# Patient Record
Sex: Female | Born: 1990 | Hispanic: Yes | Smoking: Former smoker
Health system: Southern US, Community
[De-identification: ages and names within clinical notes are randomized; demographics above are authoritative.]

## PROBLEM LIST (undated history)

## (undated) DIAGNOSIS — E282 Polycystic ovarian syndrome: Secondary | ICD-10-CM

## (undated) DIAGNOSIS — R9389 Abnormal findings on diagnostic imaging of other specified body structures: Secondary | ICD-10-CM

## (undated) DIAGNOSIS — D649 Anemia, unspecified: Secondary | ICD-10-CM

## (undated) DIAGNOSIS — R519 Headache, unspecified: Secondary | ICD-10-CM

## (undated) DIAGNOSIS — Z973 Presence of spectacles and contact lenses: Secondary | ICD-10-CM

---

## 2016-10-28 ENCOUNTER — Ambulatory Visit: Payer: Self-pay | Admitting: Dietician

## 2017-01-01 ENCOUNTER — Ambulatory Visit: Payer: Self-pay | Admitting: Skilled Nursing Facility1

## 2018-11-23 DIAGNOSIS — U071 COVID-19: Secondary | ICD-10-CM

## 2018-11-23 HISTORY — DX: COVID-19: U07.1

## 2020-10-17 ENCOUNTER — Other Ambulatory Visit: Payer: Self-pay

## 2020-10-17 ENCOUNTER — Emergency Department (HOSPITAL_COMMUNITY): Payer: Self-pay

## 2020-10-17 ENCOUNTER — Emergency Department (HOSPITAL_COMMUNITY)
Admission: EM | Admit: 2020-10-17 | Discharge: 2020-10-18 | Disposition: A | Discharge status: Home / Self Care | Payer: Self-pay | Attending: Emergency Medicine | Admitting: Emergency Medicine

## 2020-10-17 DIAGNOSIS — R55 Syncope and collapse: Secondary | ICD-10-CM | POA: Insufficient documentation

## 2020-10-17 DIAGNOSIS — R0789 Other chest pain: Secondary | ICD-10-CM

## 2020-10-17 DIAGNOSIS — K529 Noninfective gastroenteritis and colitis, unspecified: Secondary | ICD-10-CM | POA: Insufficient documentation

## 2020-10-17 LAB — BASIC METABOLIC PANEL
Anion gap: 15 (ref 5–15)
BUN: 8 mg/dL (ref 6–20)
CO2: 19 mmol/L — ABNORMAL LOW (ref 22–32)
Calcium: 9.4 mg/dL (ref 8.9–10.3)
Chloride: 105 mmol/L (ref 98–111)
Creatinine, Ser: 0.57 mg/dL (ref 0.44–1.00)
GFR, Estimated: >60 mL/min (ref >60)
Glucose, Bld: 139 mg/dL — ABNORMAL HIGH (ref 70–99)
Potassium: 3.3 mmol/L — ABNORMAL LOW (ref 3.5–5.1)
Sodium: 139 mmol/L (ref 135–145)

## 2020-10-17 LAB — CBC
HCT: 35.7 % — ABNORMAL LOW (ref 36.0–46.0)
Hemoglobin: 9.8 g/dL — ABNORMAL LOW (ref 12.0–15.0)
MCH: 17.6 pg — ABNORMAL LOW (ref 26.0–34.0)
MCHC: 27.5 g/dL — ABNORMAL LOW (ref 30.0–36.0)
MCV: 64 fL — ABNORMAL LOW (ref 80.0–100.0)
Platelets: 354 10*3/uL (ref 150–400)
RBC: 5.58 MIL/uL — ABNORMAL HIGH (ref 3.87–5.11)
RDW: 19.7 % — ABNORMAL HIGH (ref 11.5–15.5)
WBC: 7.8 10*3/uL (ref 4.0–10.5)
nRBC: 0 % (ref 0.0–0.2)

## 2020-10-17 LAB — I-STAT BETA HCG BLOOD, ED (MC, WL, AP ONLY): I-stat hCG, quantitative: <5 m[IU]/mL (ref <5)

## 2020-10-17 LAB — TROPONIN I (HIGH SENSITIVITY): Troponin I (High Sensitivity): <2 ng/L (ref <18)

## 2020-10-17 MED ORDER — LIDOCAINE VISCOUS HCL 2 % MT SOLN
15.0000 mL | Freq: Once | OROMUCOSAL | Status: AC
  Administered 2020-10-18: 15 mL via ORAL
  Filled 2020-10-17: qty 15

## 2020-10-17 MED ORDER — ALUM & MAG HYDROXIDE-SIMETH 200-200-20 MG/5ML PO SUSP
30.0000 mL | Freq: Once | ORAL | Status: AC
  Administered 2020-10-18: 30 mL via ORAL
  Filled 2020-10-17: qty 30

## 2020-10-17 MED ORDER — LACTATED RINGERS IV BOLUS
1000.0000 mL | Freq: Once | INTRAVENOUS | Status: AC
  Administered 2020-10-18: 1000 mL via INTRAVENOUS

## 2020-10-17 MED ORDER — ONDANSETRON HCL 4 MG/2ML IJ SOLN
4.0000 mg | Freq: Once | INTRAMUSCULAR | Status: AC
  Administered 2020-10-18: 4 mg via INTRAVENOUS
  Filled 2020-10-17: qty 2

## 2020-10-17 NOTE — ED Provider Notes (Signed)
[facility] EMERGENCY DEPARTMENT Provider Note   CSN: [id number] Arrival date & time: 10/17/20  2120     History Chief Complaint  Patient presents with  . Chest Pain  . Near Syncope    Lori Tran is a 29 y.o. female.   Chest Pain Pain location:  Substernal area Pain quality: aching and burning   Pain radiates to:  Does not radiate Pain severity:  Moderate Onset quality:  Gradual Duration:  4 days Timing:  Intermittent Progression:  Waxing and waning Chronicity:  New Context: at rest   Relieved by:  Nothing Worsened by:  Nothing Ineffective treatments:  None tried Associated symptoms: abdominal pain, nausea, near-syncope and vomiting   Associated symptoms: no back pain, no cough, no fever, no headache, no palpitations and no shortness of breath   Near Syncope Associated symptoms include chest pain and abdominal pain. Pertinent negatives include no headaches and no shortness of breath.       No past medical history on file.  There are no problems to display for this patient.      OB History   No obstetric history on file.     No family history on file.  Social History   Tobacco Use  . Smoking status: Not on file  Substance Use Topics  . Alcohol use: Not on file  . Drug use: Not on file    Home Medications Prior to Admission medications   Medication Sig Start Date End Date Taking? Authorizing Provider  ondansetron (ZOFRAN) 4 MG tablet Take 1 tablet (4 mg total) by mouth every 6 (six) hours. 10/18/20   Gilda Crease, MD  pantoprazole (PROTONIX) 40 MG tablet Take 1 tablet (40 mg total) by mouth daily. 10/18/20   Gilda Crease, MD    Allergies    Patient has no known allergies.  Review of Systems   Review of Systems  Constitutional: Negative for chills and fever.  HENT: Negative for congestion and rhinorrhea.   Respiratory: Negative for cough and shortness of breath.   Cardiovascular: Positive for  chest pain and near-syncope. Negative for palpitations.  Gastrointestinal: Positive for abdominal pain, diarrhea, nausea and vomiting. Negative for anal bleeding.  Genitourinary: Positive for vaginal bleeding (PCOS, chronic). Negative for difficulty urinating and dysuria.  Musculoskeletal: Negative for arthralgias and back pain.  Skin: Negative for rash and wound.  Neurological: Negative for light-headedness and headaches.    Physical Exam Updated Vital Signs BP 120/74   Pulse 75   Temp 98.7 F (37.1 C) (Oral)   Resp (!) 25   SpO2 96%   Physical Exam Vitals and nursing note reviewed. Exam conducted with a chaperone present.  Constitutional:      General: She is not in acute distress.    Appearance: Normal appearance.  HENT:     Head: Normocephalic and atraumatic.     Nose: No rhinorrhea.  Eyes:     General:        Right eye: No discharge.        Left eye: No discharge.     Conjunctiva/sclera: Conjunctivae normal.  Cardiovascular:     Rate and Rhythm: Normal rate and regular rhythm.     Heart sounds: No murmur heard.   Pulmonary:     Effort: Pulmonary effort is normal. No respiratory distress.     Breath sounds: No stridor. No decreased breath sounds.  Chest:     Chest wall: No tenderness.  Abdominal:  General: Abdomen is flat. There is no distension.     Palpations: Abdomen is soft.     Tenderness: There is no abdominal tenderness. There is no guarding or rebound.  Musculoskeletal:        General: No tenderness or signs of injury.  Skin:    General: Skin is warm and dry.  Neurological:     General: No focal deficit present.     Mental Status: She is alert. Mental status is at baseline.     Motor: No weakness.  Psychiatric:        Mood and Affect: Mood normal.        Behavior: Behavior normal.     ED Results / Procedures / Treatments   Labs (all labs ordered are listed, but only abnormal results are displayed) Labs Reviewed  BASIC METABOLIC PANEL -  Abnormal; Notable for the following components:      Result Value   Potassium 3.3 (*)    CO2 19 (*)    Glucose, Bld 139 (*)    All other components within normal limits  CBC - Abnormal; Notable for the following components:   RBC 5.58 (*)    Hemoglobin 9.8 (*)    HCT 35.7 (*)    MCV 64.0 (*)    MCH 17.6 (*)    MCHC 27.5 (*)    RDW 19.7 (*)    All other components within normal limits  HEPATIC FUNCTION PANEL - Abnormal; Notable for the following components:   ALT 53 (*)    All other components within normal limits  LIPASE, BLOOD  CBG MONITORING, ED  I-STAT BETA HCG BLOOD, ED ([facility], [facility], [facility] ONLY)  TROPONIN I (HIGH SENSITIVITY)  TROPONIN I (HIGH SENSITIVITY)    EKG EKG Interpretation  Date/Time:  Thursday October 17 2020 21:35:35 EST Ventricular Rate:  73 PR Interval:  138 QRS Duration: 74 QT Interval:  350 QTC Calculation: 385 R Axis:   81 Text Interpretation: Normal sinus rhythm Normal ECG Confirmed by Cherlynn Perches ([id number]) on 10/18/2020 12:01:00 AM Also confirmed by Cherlynn Perches ([id number]), editor Elita Quick ([id number])  on 10/18/2020 10:18:39 AM   Radiology DG Chest Portable 1 View  Result Date: 10/17/2020 CLINICAL DATA:  Nausea, vomiting, abdominal pain for 4 days. Today near syncopal episode. EXAM: PORTABLE CHEST 1 VIEW COMPARISON:  None. FINDINGS: The heart size and mediastinal contours are within normal limits. Both lungs are clear. The visualized skeletal structures are unremarkable. IMPRESSION: No active disease. Electronically Signed   By: Burman Nieves M.D.   On: 10/17/2020 23:25    Procedures Procedures (including critical care time)  Medications Ordered in ED Medications  alum & mag hydroxide-simeth (MAALOX/MYLANTA) 200-200-20 MG/5ML suspension 30 mL (30 mLs Oral Given 10/18/20 0015)    And  lidocaine (XYLOCAINE) 2 % viscous mouth solution 15 mL (15 mLs Oral Given 10/18/20 0015)  ondansetron (ZOFRAN) injection 4 mg (4 mg Intravenous Given 10/18/20 0015)   lactated ringers bolus 1,000 mL (0 mLs Intravenous Stopped 10/18/20 0115)    ED Course  I have reviewed the triage vital signs and the nursing notes.  Pertinent labs & imaging results that were available during my care of the patient were reviewed by me and considered in my medical decision making (see chart for details).    MDM Rules/Calculators/A&P                          Worsening nausea vomiting diarrhea, chest pain,  pain comes on that she feels nauseated has to vomit.  Intermittent for the last 4 days.  No significant medical history other PCOS and chronic anemia, has been told to take medications for all these problems but does not do so because she does not feel that they work.  Vital signs here are stable she is afebrile.  She has a nonperitoneal ache abdomen.  She has concerns for gastritis versus other nonspecific chest pain.  Her PERC status is negative.  EKG shows sinus rhythm without acute ischemic change interval abnormality or arrhythmia.  Chest x-ray reviewed by me and radiology shows no acute cardiopulmonary pathology.  Chemistry shows metabolic acidosis, possibly ketosis in the setting of dehydration.  IV fluids given IV antiemetics given.  She also get a GI cocktail as this pain does sound to be gastrointestinal in origin.  She has a first-time troponin is undetectable.  She is getting a second troponin and we are adding on lipase and LFTs to her triage labs.  She will need these labs resulted she will need to improve her hydration status prior to leaving.  Pt care was handed off to on coming provider at 0000.  Complete history and physical and current plan have been communicated.  Please refer to their note for the remainder of ED care and ultimate disposition.  Pt seen in conjunction with Dr. Blinda Leatherwood   Final Clinical Impression(s) / ED Diagnoses Final diagnoses:  Atypical chest pain  Gastroenteritis    Rx / DC Orders ED Discharge Orders         Ordered     ondansetron (ZOFRAN) 4 MG tablet  Every 6 hours        10/18/20 0210    pantoprazole (PROTONIX) 40 MG tablet  Daily        10/18/20 0210           Sabino Donovan, MD 10/18/20 1443

## 2020-10-17 NOTE — ED Triage Notes (Signed)
Pt reports n/v/abd pain X4 days. Tonight she reports a near syncopal episode and her sister told her her lips were turning blue.  Pt is alert and oriented, did become anxious in triage, RN assisted her in slowing down her breathing and she appeared to be doing better.

## 2020-10-18 LAB — HEPATIC FUNCTION PANEL
ALT: 53 U/L — ABNORMAL HIGH (ref 0–44)
AST: 38 U/L (ref 15–41)
Albumin: 3.7 g/dL (ref 3.5–5.0)
Alkaline Phosphatase: 123 U/L (ref 38–126)
Bilirubin, Direct: <0.1 mg/dL (ref 0.0–0.2)
Total Bilirubin: 0.7 mg/dL (ref 0.3–1.2)
Total Protein: 7.5 g/dL (ref 6.5–8.1)

## 2020-10-18 LAB — LIPASE, BLOOD: Lipase: 28 U/L (ref 11–51)

## 2020-10-18 LAB — TROPONIN I (HIGH SENSITIVITY): Troponin I (High Sensitivity): <2 ng/L (ref <18)

## 2020-10-18 MED ORDER — ONDANSETRON HCL 4 MG PO TABS: 4.0000 mg | ORAL_TABLET | Freq: Four times a day (QID) | ORAL | 0 refills | Status: DC

## 2020-10-18 MED ORDER — PANTOPRAZOLE SODIUM 40 MG PO TBEC: 40.0000 mg | DELAYED_RELEASE_TABLET | Freq: Every day | ORAL | 0 refills | Status: DC

## 2020-10-18 NOTE — ED Provider Notes (Signed)
Patient signed out to me by Dr. Myrtis Ser to follow-up on lab work.  Patient seen with gastroenteritis symptoms and chest discomfort.  LFTs, lipase, troponin pending at time of signout.  These have been reviewed and are normal.  Patient felt to be extremely low risk for cardiac etiology, no further cardiac work-up necessary.  Will discharge with antiemetics, PPI.   Gilda Crease, MD 10/18/20 [phone]

## 2020-10-24 ENCOUNTER — Encounter (HOSPITAL_COMMUNITY): Payer: Self-pay | Admitting: Emergency Medicine

## 2020-10-24 ENCOUNTER — Emergency Department (HOSPITAL_COMMUNITY): Payer: Self-pay

## 2020-10-24 ENCOUNTER — Other Ambulatory Visit: Payer: Self-pay

## 2020-10-24 ENCOUNTER — Emergency Department (HOSPITAL_COMMUNITY)
Admission: EM | Admit: 2020-10-24 | Discharge: 2020-10-24 | Disposition: A | Discharge status: Home / Self Care | Payer: Self-pay | Attending: Emergency Medicine | Admitting: Emergency Medicine

## 2020-10-24 DIAGNOSIS — N939 Abnormal uterine and vaginal bleeding, unspecified: Secondary | ICD-10-CM

## 2020-10-24 DIAGNOSIS — D649 Anemia, unspecified: Secondary | ICD-10-CM

## 2020-10-24 DIAGNOSIS — R102 Pelvic and perineal pain: Secondary | ICD-10-CM

## 2020-10-24 DIAGNOSIS — R42 Dizziness and giddiness: Secondary | ICD-10-CM | POA: Insufficient documentation

## 2020-10-24 DIAGNOSIS — R0789 Other chest pain: Secondary | ICD-10-CM | POA: Insufficient documentation

## 2020-10-24 HISTORY — DX: Polycystic ovarian syndrome: E28.2

## 2020-10-24 LAB — URINALYSIS, ROUTINE W REFLEX MICROSCOPIC
Bilirubin Urine: NEGATIVE
Glucose, UA: NEGATIVE mg/dL
Ketones, ur: NEGATIVE mg/dL
Nitrite: NEGATIVE
Protein, ur: 100 mg/dL — AB
Specific Gravity, Urine: 1.008 (ref 1.005–1.030)
WBC, UA: >50 WBC/hpf — ABNORMAL HIGH (ref 0–5)
pH: 6 (ref 5.0–8.0)

## 2020-10-24 LAB — BASIC METABOLIC PANEL
Anion gap: 10 (ref 5–15)
BUN: 9 mg/dL (ref 6–20)
CO2: 20 mmol/L — ABNORMAL LOW (ref 22–32)
Calcium: 8.8 mg/dL — ABNORMAL LOW (ref 8.9–10.3)
Chloride: 106 mmol/L (ref 98–111)
Creatinine, Ser: 0.68 mg/dL (ref 0.44–1.00)
GFR, Estimated: >60 mL/min (ref >60)
Glucose, Bld: 152 mg/dL — ABNORMAL HIGH (ref 70–99)
Potassium: 3.6 mmol/L (ref 3.5–5.1)
Sodium: 136 mmol/L (ref 135–145)

## 2020-10-24 LAB — CBC
HCT: 29.5 % — ABNORMAL LOW (ref 36.0–46.0)
Hemoglobin: 8.2 g/dL — ABNORMAL LOW (ref 12.0–15.0)
MCH: 17.9 pg — ABNORMAL LOW (ref 26.0–34.0)
MCHC: 27.8 g/dL — ABNORMAL LOW (ref 30.0–36.0)
MCV: 64.3 fL — ABNORMAL LOW (ref 80.0–100.0)
Platelets: 314 10*3/uL (ref 150–400)
RBC: 4.59 MIL/uL (ref 3.87–5.11)
RDW: 19.1 % — ABNORMAL HIGH (ref 11.5–15.5)
WBC: 5.9 10*3/uL (ref 4.0–10.5)
nRBC: 0 % (ref 0.0–0.2)

## 2020-10-24 LAB — WET PREP, GENITAL
Clue Cells Wet Prep HPF POC: NONE SEEN
Sperm: NONE SEEN
Trich, Wet Prep: NONE SEEN
Yeast Wet Prep HPF POC: NONE SEEN

## 2020-10-24 LAB — I-STAT BETA HCG BLOOD, ED (MC, WL, AP ONLY): I-stat hCG, quantitative: <5 m[IU]/mL (ref <5)

## 2020-10-24 MED ORDER — METOCLOPRAMIDE HCL 5 MG/ML IJ SOLN
10.0000 mg | Freq: Once | INTRAMUSCULAR | Status: AC
  Administered 2020-10-24: 10 mg via INTRAVENOUS
  Filled 2020-10-24: qty 2

## 2020-10-24 MED ORDER — DIPHENHYDRAMINE HCL 50 MG/ML IJ SOLN
50.0000 mg | Freq: Once | INTRAMUSCULAR | Status: AC
  Administered 2020-10-24: 50 mg via INTRAVENOUS
  Filled 2020-10-24: qty 1

## 2020-10-24 MED ORDER — HYDROXYZINE HCL 25 MG PO TABS
25.0000 mg | ORAL_TABLET | Freq: Once | ORAL | Status: AC
  Administered 2020-10-24: 25 mg via ORAL
  Filled 2020-10-24: qty 1

## 2020-10-24 MED ORDER — KETOROLAC TROMETHAMINE 15 MG/ML IJ SOLN
15.0000 mg | Freq: Once | INTRAMUSCULAR | Status: AC
  Administered 2020-10-24: 15 mg via INTRAVENOUS
  Filled 2020-10-24: qty 1

## 2020-10-24 MED ORDER — SODIUM CHLORIDE 0.9 % IV BOLUS
1000.0000 mL | Freq: Once | INTRAVENOUS | Status: AC
  Administered 2020-10-24: 1000 mL via INTRAVENOUS

## 2020-10-24 MED ORDER — MEGESTROL ACETATE 40 MG PO TABS: 40.0000 mg | ORAL_TABLET | Freq: Two times a day (BID) | ORAL | 0 refills | Status: DC

## 2020-10-24 NOTE — ED Notes (Signed)
Ambulated self to the bathroom

## 2020-10-24 NOTE — ED Provider Notes (Signed)
Care assumed from Army Melia, PA-C, at shift change, please see their notes for full documentation of patient's complaint/HPI. Briefly, pt here with complaint of dizziness, headache, and nausea. Results so far show hgb 8.2 with known anemia; not on any medications. Awaiting CT Head. Plan is to reevaluate; suspect that pt's symptoms may be related to anxiety and pt provided with atarax; if improvement after atarax and CT clear pt can be discharged home. If no improvement will provide headache cocktail.    Physical Exam  BP 95/77 (BP Location: Right Arm)   Pulse (!) 53   Temp 98.1 F (36.7 C) (Oral)   Resp 16   SpO2 99%   Physical Exam Vitals and nursing note reviewed.  Constitutional:      Appearance: She is not ill-appearing.  HENT:     Head: Normocephalic and atraumatic.  Eyes:     Conjunctiva/sclera: Conjunctivae normal.  Cardiovascular:     Rate and Rhythm: Normal rate and regular rhythm.  Pulmonary:     Effort: Pulmonary effort is normal.     Breath sounds: Normal breath sounds.  Genitourinary:    Comments: Chaperone present for exam. No rashes, lesions, or tenderness to external genitalia. No erythema, injury, or tenderness to vaginal mucosa. Moderate amount of blood in vault. No hemorrhage from Os appreciated. No adnexal masses, tenderness, or fullness. No CMT, cervical friability, or discharge from cervical os. Cervical os is closed. Uterus non-deviated, mobile, nonTTP, and without enlargement.  Skin:    General: Skin is warm and dry.     Coloration: Skin is not jaundiced.  Neurological:     Mental Status: She is alert.     ED Course/Procedures   Clinical Course as of Oct 24 2054  Thu Oct 24, 2020  585 29 year old female with complaint of dizziness, headache, nausea and chest pressure.  Neuro exam is unremarkable, dizziness is reproduced with changes in position.  Report of new onset headaches for the past week, no prior head imaging.  Symptoms may be related to stress  as relayed by patient however due to new headache history with no prior imaging, will CT head.  Also given Atarax for her symptoms.  Plan is to reassess after head imaging.   [LM]  1502 Care signed out pending imaging and reassessment.    [LM]    Clinical Course User Index [LM] Jeannie Fend, PA-C    Procedures  Results for orders placed or performed during the hospital encounter of 10/24/20  Wet prep, genital   Specimen: PATH Cytology Cervicovaginal Ancillary Only  Result Value Ref Range   Yeast Wet Prep HPF POC NONE SEEN NONE SEEN   Trich, Wet Prep NONE SEEN NONE SEEN   Clue Cells Wet Prep HPF POC NONE SEEN NONE SEEN   WBC, Wet Prep HPF POC MODERATE (A) NONE SEEN   Sperm NONE SEEN   Basic metabolic panel  Result Value Ref Range   Sodium 136 135 - 145 mmol/L   Potassium 3.6 3.5 - 5.1 mmol/L   Chloride 106 98 - 111 mmol/L   CO2 20 (L) 22 - 32 mmol/L   Glucose, Bld 152 (H) 70 - 99 mg/dL   BUN 9 6 - 20 mg/dL   Creatinine, Ser 4.03 0.44 - 1.00 mg/dL   Calcium 8.8 (L) 8.9 - 10.3 mg/dL   GFR, Estimated >47 >42 mL/min   Anion gap 10 5 - 15  CBC  Result Value Ref Range   WBC 5.9 4.0 - 10.5  K/uL   RBC 4.59 3.87 - 5.11 MIL/uL   Hemoglobin 8.2 (L) 12.0 - 15.0 g/dL   HCT 74.2 (L) 36 - 46 %   MCV 64.3 (L) 80.0 - 100.0 fL   MCH 17.9 (L) 26.0 - 34.0 pg   MCHC 27.8 (L) 30.0 - 36.0 g/dL   RDW 59.5 (H) 63.8 - 75.6 %   Platelets 314 150 - 400 K/uL   nRBC 0.0 0.0 - 0.2 %  Urinalysis, Routine w reflex microscopic Urine, Clean Catch  Result Value Ref Range   Color, Urine YELLOW YELLOW   APPearance CLOUDY (A) CLEAR   Specific Gravity, Urine 1.008 1.005 - 1.030   pH 6.0 5.0 - 8.0   Glucose, UA NEGATIVE NEGATIVE mg/dL   Hgb urine dipstick LARGE (A) NEGATIVE   Bilirubin Urine NEGATIVE NEGATIVE   Ketones, ur NEGATIVE NEGATIVE mg/dL   Protein, ur 433 (A) NEGATIVE mg/dL   Nitrite NEGATIVE NEGATIVE   Leukocytes,Ua LARGE (A) NEGATIVE   RBC / HPF 21-50 0 - 5 RBC/hpf   WBC, UA >50 (H) 0 -  5 WBC/hpf   Bacteria, UA MANY (A) NONE SEEN   Squamous Epithelial / LPF 11-20 0 - 5   Mucus PRESENT   I-Stat beta hCG blood, ED  Result Value Ref Range   I-stat hCG, quantitative <5.0 <5 mIU/mL   Comment 3           CT Head Wo Contrast  Result Date: 10/24/2020 CLINICAL DATA:  Dizziness, nonspecific. Nausea, dizziness, headache, chest discomfort. EXAM: CT HEAD WITHOUT CONTRAST TECHNIQUE: Contiguous axial images were obtained from the base of the skull through the vertex without intravenous contrast. COMPARISON:  No pertinent prior exams available for comparison. FINDINGS: Brain: Cerebral volume is normal. There is no acute intracranial hemorrhage. No demarcated cortical infarct. No extra-axial fluid collection. No evidence of intracranial mass. No midline shift. Vascular: No hyperdense vessel. Skull: Normal. Negative for fracture or focal lesion. Sinuses/Orbits: Visualized orbits show no acute finding. Trace ethmoid sinus mucosal thickening. IMPRESSION: Unremarkable non-contrast CT appearance of the brain. No evidence of acute intracranial abnormality. Electronically Signed   By: Jackey Loge DO   On: 10/24/2020 15:19   US PELVIC COMPLETE W TRANSVAGINAL AND TORSION R/O  Result Date: 10/24/2020 CLINICAL DATA:  Heavy vaginal bleeding for several years. Pelvic pain for several months. EXAM: TRANSABDOMINAL AND TRANSVAGINAL ULTRASOUND OF PELVIS DOPPLER ULTRASOUND OF OVARIES TECHNIQUE: Both transabdominal and transvaginal ultrasound examinations of the pelvis were performed. Transabdominal technique was performed for global imaging of the pelvis including uterus, ovaries, adnexal regions, and pelvic cul-de-sac. It was necessary to proceed with endovaginal exam following the transabdominal exam to visualize the ovaries. Color and duplex Doppler ultrasound was utilized to evaluate blood flow to the ovaries. COMPARISON:  None. FINDINGS: Uterus Measurements: 10 x 4.5 x 4.7 cm (volume = 110 cm^3.) No fibroids or  other mass visualized. Endometrium Thickness: 1.6 cm.  No focal abnormality visualized. Right ovary Measurements: 2.6 x 1.5 x 2.3 cm = volume: 4.7 mL. Normal appearance/no adnexal mass. Left ovary Measurements: 1.9 x 1.4 x 1.8 cm = volume: 2.5 mL. Normal appearance/no adnexal mass. Pulsed Doppler evaluation of both ovaries demonstrates normal low-resistance arterial and venous waveforms. Other findings No abnormal free fluid. IMPRESSION: 1. The endometrium measures 1.6 cm in maximum thickness. This is upper limits of normal. If bleeding remains unresponsive to hormonal or medical therapy, focal lesion work-up with sonohysterogram should be considered. Endometrial biopsy should also be considered in pre-menopausal  patients at high risk for endometrial carcinoma. (Ref: Radiological Reasoning: Algorithmic Workup of Abnormal Vaginal Bleeding with Endovaginal Sonography and Sonohysterography. AJR 2008; 491:P91-50) Electronically Signed   By: Signa Kell M.D.   On: 10/24/2020 19:14    MDM  CT Head negative. Pt reevaluated; states no improvement in symptoms. Will provide fluids and headache cocktail.   U/A has returned with large leuks, 21-50 RBCs, and > 50 WBCs. Pt is currently on her menses. Denies any urinary symptoms - states she is having some pelvic pressure however she has PCOS and this is normal for her. Will plan for urine culture however will hold off on treatment at this time.   Nursing staff informed that pt provided to him that she has been bleeding consecutively for the past several years; she has not seen an OBGYN for same. She would like a pelvic exam done today while she is here. Her Hgb is significantly lower at 8.2 vs 9.8 1 week ago.   Hemoglobin  Date Value Ref Range Status  10/24/2020 8.2 (L) 12.0 - 15.0 g/dL Final    Comment:    Reticulocyte Hemoglobin testing may be clinically indicated, consider ordering this additional test [id number]   10/17/2020 9.8 (L) 12.0 - 15.0 g/dL Final     Pelvic ultrasound performed: IMPRESSION:  1. The endometrium measures 1.6 cm in maximum thickness. This is  upper limits of normal. If bleeding remains unresponsive to hormonal  or medical therapy, focal lesion work-up with sonohysterogram should  be considered. Endometrial biopsy should also be considered in  pre-menopausal patients at high risk for endometrial carcinoma.  (Ref: Radiological Reasoning: Algorithmic Workup of Abnormal Vaginal  Bleeding with Endovaginal Sonography and Sonohysterography. AJR  2008; 165:V37-48)   Pelvic exam with moderate amount of blood in vault. No other findings. Have updated patient on ultrasound results. She is instructed to follow up with OBGYN for same. Have discussed with Dr. Theodoro Parma on call who recommends Megace 40 mg BID until the bleeding stops with close OBGYN follow up.  I suspect anemia is causing pt's symptoms today. She is in agreement with plan and stable for discharge home.   This note was prepared using Dragon voice recognition software and may include unintentional dictation errors due to the inherent limitations of voice recognition software.    Tanda Rockers, PA-C 10/24/20 2055    Benjiman Core, MD 10/25/20 [phone]

## 2020-10-24 NOTE — Discharge Instructions (Addendum)
Please pick up medication and take daily until the bleeding stops. Follow up with [facility] for follow up purposes given your prolonged vaginal bleeding. They can represcribe additional medication for you. I suspect significant anemia is likely the cause of your symptoms today. Once the bleeding stops it should help with your anemia as well.   Return to the ED for any worsening symptoms

## 2020-10-24 NOTE — ED Notes (Signed)
Asked pt if she could provide urine sample. Pt states not at this moment. Gave pt urine cup

## 2020-10-24 NOTE — ED Notes (Signed)
Pt transported to CT ?

## 2020-10-24 NOTE — ED Provider Notes (Signed)
[facility] EMERGENCY DEPARTMENT Provider Note   CSN: [id number] Arrival date & time: 10/24/20  4098     History Chief Complaint  Patient presents with   Dizziness   Nausea    Lori Tran is a 29 y.o. female.  29 year old female presents with complaint of nausea, dizziness, headache and chest discomfort.  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  Dizziness resolves with holding still, returns with changes in position.  Reports initially right temple headache now across right frontal area.  States that she has been getting these headaches for the past week and has been taking ibuprofen for them.  No prior headache history.  Patient states that she felt so nauseous she developed a pressure sensation in her chest, pressure sensation in her chest only happens when she feels nauseous.  Patient was seen in the emergency room about a week ago with similar symptoms with a negative work-up, was discharged home on Protonix which she is not taking because she feels like this causes diarrhea.  Reports history of prediabetes due to her PCOS, is not on Metformin due to diarrhea from the medication.  Patient does not normally eat breakfast in the morning prior to going to work, does not monitor her blood sugar.  Denies any unilateral weakness or numbness, changes in vision, speech, gait.  Patient reports her symptoms started shortly after she broke up with her fianc and he moved out, she is tearful and questions if stress can cause her to have the symptoms she has been experiencing.  No other complaints or concerns.        Past Medical History:  Diagnosis Date   PCOS (polycystic ovarian syndrome)     There are no problems to display for this patient.   History reviewed. No pertinent surgical history.   OB History   No obstetric history on file.     No family history on  file.  Social History   Tobacco Use   Smoking status: Not on file  Substance Use Topics   Alcohol use: Not on file   Drug use: Not on file    Home Medications Prior to Admission medications   Medication Sig Start Date End Date Taking? Authorizing Provider  ondansetron (ZOFRAN) 4 MG tablet Take 1 tablet (4 mg total) by mouth every 6 (six) hours. 10/18/20   Gilda Crease, MD  pantoprazole (PROTONIX) 40 MG tablet Take 1 tablet (40 mg total) by mouth daily. 10/18/20   Gilda Crease, MD    Allergies    Patient has no known allergies.  Review of Systems   Review of Systems  Constitutional: Negative for chills and fever.  Eyes: Negative for visual disturbance.  Respiratory: Negative for shortness of breath.   Cardiovascular: Positive for chest pain.  Gastrointestinal: Positive for nausea. Negative for abdominal pain, constipation, diarrhea and vomiting.  Musculoskeletal: Negative for arthralgias, back pain and myalgias.  Skin: Negative for rash and wound.  Allergic/Immunologic: Negative for immunocompromised state.  Neurological: Positive for dizziness and headaches. Negative for speech difficulty, weakness and numbness.  Psychiatric/Behavioral: Negative for confusion. The patient is nervous/anxious.   All other systems reviewed and are negative.   Physical Exam Updated Vital Signs BP 95/77 (BP Location: Right Arm)    Pulse (!) 53    Temp 98.1 F (36.7 C) (Oral)    Resp 16  SpO2 99%   Physical Exam Vitals and nursing note reviewed.  Constitutional:      General: She is not in acute distress.    Appearance: She is well-developed. She is not diaphoretic.  HENT:     Head: Normocephalic and atraumatic.     Mouth/Throat:     Mouth: Mucous membranes are moist.  Eyes:     Extraocular Movements: Extraocular movements intact.     Pupils: Pupils are equal, round, and reactive to light.  Cardiovascular:     Rate and Rhythm: Normal rate and regular rhythm.      Pulses: Normal pulses.     Heart sounds: Normal heart sounds.  Pulmonary:     Effort: Pulmonary effort is normal.     Breath sounds: Normal breath sounds.  Skin:    General: Skin is warm and dry.     Findings: No erythema or rash.  Neurological:     Mental Status: She is alert and oriented to person, place, and time.     Cranial Nerves: No cranial nerve deficit.     Sensory: No sensory deficit.     Motor: No weakness.     Coordination: Coordination normal.  Psychiatric:        Behavior: Behavior normal.     ED Results / Procedures / Treatments   Labs (all labs ordered are listed, but only abnormal results are displayed) Labs Reviewed  BASIC METABOLIC PANEL - Abnormal; Notable for the following components:      Result Value   CO2 20 (*)    Glucose, Bld 152 (*)    Calcium 8.8 (*)    All other components within normal limits  CBC - Abnormal; Notable for the following components:   Hemoglobin 8.2 (*)    HCT 29.5 (*)    MCV 64.3 (*)    MCH 17.9 (*)    MCHC 27.8 (*)    RDW 19.1 (*)    All other components within normal limits  URINALYSIS, ROUTINE W REFLEX MICROSCOPIC  CBG MONITORING, ED  I-STAT BETA HCG BLOOD, ED ([facility], [facility], [facility] ONLY)    EKG None  Radiology No results found.  Procedures Procedures (including critical care time)  Medications Ordered in ED Medications  hydrOXYzine (ATARAX/VISTARIL) tablet 25 mg (25 mg Oral Given 10/24/20 1403)    ED Course  I have reviewed the triage vital signs and the nursing notes.  Pertinent labs & imaging results that were available during my care of the patient were reviewed by me and considered in my medical decision making (see chart for details).  Clinical Course as of Oct 24 1501  Thu Oct 24, 2020  274 29 year old female with complaint of dizziness, headache, nausea and chest pressure.  Neuro exam is unremarkable, dizziness is reproduced with changes in position.  Report of new onset headaches for the past week, no prior  head imaging.  Symptoms may be related to stress as relayed by patient however due to new headache history with no prior imaging, will CT head.  Also given Atarax for her symptoms.  Plan is to reassess after head imaging.   [LM]  1502 Care signed out pending imaging and reassessment.    [LM]    Clinical Course User Index [LM] Alden Hipp   MDM Rules/Calculators/A&P                          Final Clinical Impression(s) / ED Diagnoses Final diagnoses:  None    Rx / DC Orders ED Discharge Orders    None       Alden Hipp 10/24/20 1502    Lori Monday, MD 10/25/20 [phone]

## 2020-10-24 NOTE — ED Triage Notes (Signed)
Pt arrives via [facility], reports nausea and dizziness upon waking, states she went to work and began having burning in her chest. Reports she was here for similar about a week ago, placed on some medications but states she began having diarrhea from the meds so she stopped them. A/ox4, resp e/u, nad. EMS VSS 138/82, HR 60, o2 100% ra, cbg 161

## 2020-10-25 LAB — GC/CHLAMYDIA PROBE AMP (~~LOC~~) NOT AT ARMC
Chlamydia: NEGATIVE
Comment: NEGATIVE
Comment: NORMAL
Neisseria Gonorrhea: NEGATIVE

## 2020-10-26 LAB — URINE CULTURE

## 2020-11-10 ENCOUNTER — Encounter (HOSPITAL_COMMUNITY): Payer: Self-pay | Admitting: *Deleted

## 2020-11-10 ENCOUNTER — Emergency Department (HOSPITAL_COMMUNITY)
Admission: EM | Admit: 2020-11-10 | Discharge: 2020-11-11 | Disposition: A | Discharge status: Home / Self Care | Payer: Self-pay | Attending: Emergency Medicine | Admitting: Emergency Medicine

## 2020-11-10 ENCOUNTER — Other Ambulatory Visit: Payer: Self-pay

## 2020-11-10 DIAGNOSIS — Z5321 Procedure and treatment not carried out due to patient leaving prior to being seen by health care provider: Secondary | ICD-10-CM | POA: Insufficient documentation

## 2020-11-10 DIAGNOSIS — R1030 Lower abdominal pain, unspecified: Secondary | ICD-10-CM | POA: Insufficient documentation

## 2020-11-10 DIAGNOSIS — R350 Frequency of micturition: Secondary | ICD-10-CM | POA: Insufficient documentation

## 2020-11-10 DIAGNOSIS — R102 Pelvic and perineal pain: Secondary | ICD-10-CM | POA: Insufficient documentation

## 2020-11-10 DIAGNOSIS — N898 Other specified noninflammatory disorders of vagina: Secondary | ICD-10-CM | POA: Insufficient documentation

## 2020-11-10 LAB — COMPREHENSIVE METABOLIC PANEL
ALT: 37 U/L (ref 0–44)
AST: 22 U/L (ref 15–41)
Albumin: 3.6 g/dL (ref 3.5–5.0)
Alkaline Phosphatase: 140 U/L — ABNORMAL HIGH (ref 38–126)
Anion gap: 12 (ref 5–15)
BUN: 11 mg/dL (ref 6–20)
CO2: 16 mmol/L — ABNORMAL LOW (ref 22–32)
Calcium: 9.1 mg/dL (ref 8.9–10.3)
Chloride: 108 mmol/L (ref 98–111)
Creatinine, Ser: 0.73 mg/dL (ref 0.44–1.00)
GFR, Estimated: >60 mL/min (ref >60)
Glucose, Bld: 196 mg/dL — ABNORMAL HIGH (ref 70–99)
Potassium: 3.7 mmol/L (ref 3.5–5.1)
Sodium: 136 mmol/L (ref 135–145)
Total Bilirubin: 0.5 mg/dL (ref 0.3–1.2)
Total Protein: 7.9 g/dL (ref 6.5–8.1)

## 2020-11-10 LAB — I-STAT BETA HCG BLOOD, ED (MC, WL, AP ONLY): I-stat hCG, quantitative: <5 m[IU]/mL (ref <5)

## 2020-11-10 LAB — CBC
HCT: 34.7 % — ABNORMAL LOW (ref 36.0–46.0)
Hemoglobin: 9.6 g/dL — ABNORMAL LOW (ref 12.0–15.0)
MCH: 17.4 pg — ABNORMAL LOW (ref 26.0–34.0)
MCHC: 27.7 g/dL — ABNORMAL LOW (ref 30.0–36.0)
MCV: 62.7 fL — ABNORMAL LOW (ref 80.0–100.0)
Platelets: 396 10*3/uL (ref 150–400)
RBC: 5.53 MIL/uL — ABNORMAL HIGH (ref 3.87–5.11)
RDW: 20.1 % — ABNORMAL HIGH (ref 11.5–15.5)
WBC: 10.4 10*3/uL (ref 4.0–10.5)
nRBC: 0 % (ref 0.0–0.2)

## 2020-11-10 LAB — LIPASE, BLOOD: Lipase: 30 U/L (ref 11–51)

## 2020-11-10 MED ORDER — OXYCODONE-ACETAMINOPHEN 5-325 MG PO TABS
1.0000 | ORAL_TABLET | ORAL | Status: DC | PRN
  Administered 2020-11-10: 1 via ORAL
  Filled 2020-11-10: qty 1

## 2020-11-10 NOTE — ED Triage Notes (Signed)
Pt reports onset of lower abd/pelvic pain x 3 days. Pain has become severe and reports yellow vaginal discharge and frequent urination.

## 2020-11-11 NOTE — ED Notes (Signed)
Called pt for vitals recheck and no answer 

## 2020-11-14 ENCOUNTER — Ambulatory Visit (INDEPENDENT_AMBULATORY_CARE_PROVIDER_SITE_OTHER): Payer: Self-pay | Admitting: Obstetrics & Gynecology

## 2020-11-14 ENCOUNTER — Encounter: Payer: Self-pay | Admitting: Obstetrics & Gynecology

## 2020-11-14 ENCOUNTER — Other Ambulatory Visit: Payer: Self-pay

## 2020-11-14 VITALS — BP 132/82 | HR 79 | Ht 61.0 in | Wt 202.5 lb

## 2020-11-14 DIAGNOSIS — Z3202 Encounter for pregnancy test, result negative: Secondary | ICD-10-CM

## 2020-11-14 DIAGNOSIS — E282 Polycystic ovarian syndrome: Secondary | ICD-10-CM

## 2020-11-14 DIAGNOSIS — N926 Irregular menstruation, unspecified: Secondary | ICD-10-CM | POA: Insufficient documentation

## 2020-11-14 DIAGNOSIS — F3289 Other specified depressive episodes: Secondary | ICD-10-CM

## 2020-11-14 DIAGNOSIS — F419 Anxiety disorder, unspecified: Secondary | ICD-10-CM

## 2020-11-14 DIAGNOSIS — D500 Iron deficiency anemia secondary to blood loss (chronic): Secondary | ICD-10-CM

## 2020-11-14 LAB — POCT PREGNANCY, URINE: Preg Test, Ur: NEGATIVE

## 2020-11-14 MED ORDER — FERROUS SULFATE 325 (65 FE) MG PO TABS: ORAL_TABLET | ORAL | 0 refills | Status: AC

## 2020-11-14 MED ORDER — MEGESTROL ACETATE 40 MG PO TABS: 40.0000 mg | ORAL_TABLET | Freq: Two times a day (BID) | ORAL | 1 refills | Status: DC

## 2020-11-14 NOTE — Patient Instructions (Addendum)
Call [facility] to schedule free pap smear [phone]   Call MyChart help desk at [phone]  To help with linking [facility] MyChart and [facility] MyChart  Please start iron sulfate 325mg  every other day.  Take with OJ if possible.

## 2020-11-14 NOTE — Progress Notes (Signed)
GYNECOLOGY  VISIT  CC:   Menorrhagia/anemia  HPI: 29 y.o. G0 Hispanic female here for discussion of recent ultrasounds and of five years of heavy cycles.  Pt reports hx of abnormal cycles that has been present for several years.  She reports she has ongoing bleeding for most days out of the month.  In any given month, she will only have five days of no bleeding.  She doesn't really know when she has a cycle and when she doesn't.  Some days of bleeding can be really heavy with clotting.  On the heaviest days, she can need to change a maxi pad every 30 minutes.    Ultrasound done 10/24/2020 at [facility] ER. Endometrium was 1.6cm.  She was started on Megace.  Ultrasound done at [facility] (which I could review on her phone) on 12/202/2021 showed a subtle thickening in the cervical region measuring up to 1.8cm but the entire endometrium was thickened and hypervascular.    GYNECOLOGIC HISTORY: No LMP recorded (lmp unknown). (Menstrual status: Irregular Periods). Contraception: abstinence   Patient Active Problem List   Diagnosis Date Noted   Irregular bleeding 11/14/2020   PCOS (polycystic ovarian syndrome) 11/14/2020    Past Medical History:  Diagnosis Date   Anxiety    Depression    PCOS (polycystic ovarian syndrome)     History reviewed. No pertinent surgical history.  MEDS:   Current Outpatient Medications on File Prior to Visit  Medication Sig Dispense Refill   Multiple Vitamin (MULTIVITAMIN ADULT PO) Take 1 tablet by mouth daily.     ondansetron (ZOFRAN) 4 MG tablet Take 1 tablet (4 mg total) by mouth every 6 (six) hours. 12 tablet 0   oxyCODONE (OXY IR/ROXICODONE) 5 MG immediate release tablet SMARTSIG:1 By Mouth 4-5 Times Daily (Patient not taking: Reported on 11/14/2020)     No current facility-administered medications on file prior to visit.    ALLERGIES: Patient has no known allergies.  Family History  Problem Relation Age of Onset   Diabetes Mother     SH:  Separated,  non smoker  Review of Systems  Constitutional: Negative.   Respiratory: Negative.   Cardiovascular: Negative.   Genitourinary: Positive for menstrual problem (irregular bleeding).  Skin: Negative.   Hematological: Negative.     PHYSICAL EXAMINATION:    BP 132/82    Pulse 79    Ht 5\' 1"  (1.549 m)    Wt 202 lb 8 oz (91.9 kg)    LMP  (LMP Unknown)    BMI 38.26 kg/m     General appearance: alert, cooperative and appears stated age CV:  Regular rate and rhythm Lungs:  clear to auscultation, no wheezes, rales or rhonchi, symmetric air entry Abdomen: soft, non-tender; bowel sounds normal; no masses,  no organomegaly Lymph:  no inguinal LAD noted  Pelvic: External genitalia:  no lesions              Urethra:  normal appearing urethra with no masses, tenderness or lesions              Bartholins and Skenes: normal                 Vagina: normal appearing vagina with normal color and discharge, no lesions              Cervix: prolapsed cervical polyp about 4cm in length              Bimanual Exam:  Uterus:  normal size, contour, position,  consistency, mobility, non-tender              Adnexa: no mass, fullness, tenderness  Chaperone, Howard Pouch, RN, was present for exam.  Assessment/Plan: 1. Irregular bleeding - Cervical polyp noted.  D/w pt surgical removal with hysteroscopy and D&C.  As I am unsure of size of this polyp, current anemia with 9.3 hb, ultrasound findings, feel OR procedure would be best for pt.  She is going to go financial application and hopefully will be able to proceed after that time.  - Continue Megace 40mg  bid.  #60/1RF.  2. Anxiety - Ambulatory referral to Integrated Behavioral Health  3. Other depression - Ambulatory referral to Integrated Behavioral Health  4. Iron deficiency anemia due to chronic blood loss - ferrous sulfate 325 (65 FE) MG tablet; Take 1 tab every other day with OJ.  Dispense: 30 tablet; Refill: 0  5. PCOS (polycystic ovarian  syndrome) - Pt need hba1c and fasting insulin level obtained.  Will wait until financial application is completed.

## 2020-11-14 NOTE — Progress Notes (Signed)
Referred from [facility] ED because she passed out . During examination found to be anemic and discussed she has heavy periods. Patient c/o last 5 years has had bleeding everyday with exception of maybe 1-2 days. States bleeding varies between spotting to heavy. Stated ED gave her medicine -megestrol to  Take for 15 days. States took from 10/25/20 to 11/09/20. States bleeding stopped on 3rd day of medicine. States starting spotting again 11/12/20. States having bad pain and went to [facility] on 11/11/20. They did an [place] and said she has mass.  Today elevated phq9 and gad7, offered referral to [facility] which she accepted. States has food issues sometimes - offered food market - but states is ok now  Rebekah Zackery,RN

## 2020-11-26 NOTE — BH Specialist Note (Addendum)
Integrated Behavioral Health via Telemedicine Visit  11/26/2020 Lori Tran [id number]  Number of Integrated Behavioral Health visits: 1 Session Start time: 8:12  Session End time: 9:04 Total time: 39  Referring Provider: Valentina Shaggy, MD Patient/Family location: [facility] Provider location: Center for [facility]  All persons participating in visit: Patient Lori Tran and Lori Tran   Types of Service: Telephone visit  I connected with Lori Tran and/or Lori Tran n/a by Telephone  (Video is Caregility application) and verified that I am speaking with the correct person using two identifiers.Discussed confidentiality: Yes   I discussed the limitations of telemedicine and the availability of in person appointments.  Discussed there is a possibility of technology failure and discussed alternative modes of communication if that failure occurs.  I discussed that engaging in this telemedicine visit, they consent to the provision of behavioral healthcare and the services will be billed under their insurance.  Patient and/or legal guardian expressed understanding and consented to Telemedicine visit: Yes   Presenting Concerns: Patient and/or family reports the following symptoms/concerns: Pt states her primary concern today is escalating depression, anxiety with panic, waking up with nightmares, and SI that began over 8 years ago, and has increased in the past week after breakup of 8 year relationship with fiance.  Duration of problem: Ongoing, with increase in past week; Severity of problem: severe  Patient and/or Family's Strengths/Protective Factors: Social connections and [place] supports in place (healthy food, safe environments, etc.)  Goals Addressed: Patient will: 1.  Reduce symptoms of: anxiety and depression  2.  Increase knowledge and/or ability of: self-management skills and Safety  3.   Demonstrate ability to: Increase healthy adjustment to current life circumstances, Increase adequate support systems for patient/family and Increase motivation to adhere to plan of care  Progress towards Goals: Ongoing  Interventions: Interventions utilized:  Mindfulness or Management consultant, Psychoeducation and/or Health Education and Safety Standardized Assessments completed: C-SSRS Short  Patient and/or Family Response: Pt agrees to treatment plan  Assessment: Patient currently experiencing Major depressive disorder, recurrent, severe, without psychotic features.   Patient may benefit from psychoeducation and brief therapeutic interventions regarding coping with symptoms of depression, anxiety with depression, and Safety planning for SI.  Marland Kitchen  Plan: 1. Follow up with behavioral health clinician on : One day by phone; one week virtual visit; Call Miguel Medal at [phone] if needed prior to scheduled visit 2. Behavioral recommendations:  -Follow Safety Plan: Go to [facility] today  -CALM relaxation breathing exercise twice daily (morning; at bedtime with sleep sounds) to help manage emotions at this time -Read educational materials regarding coping with symptoms of anxiety with panic attack -Consider apps (on After Visit Summary) as additional self-care during this time  3. Referral(s): Integrated Art gallery manager (In Clinic) and [facility] (LME/Outside Clinic)  I discussed the assessment and treatment plan with the patient and/or parent/guardian. They were provided an opportunity to ask questions and all were answered. They agreed with the plan and demonstrated an understanding of the instructions.   They were advised to call back or seek an in-person evaluation if the symptoms worsen or if the condition fails to improve as anticipated.  Rae Lips, LCSW   Depression screen [facility] 2/9 11/14/2020  Decreased  Interest 3  Down, Depressed, Hopeless 3  PHQ - 2 Score 6  Altered sleeping 2  Tired, decreased energy 2  Change  in appetite 3  Feeling bad or failure about yourself  1  Trouble concentrating 0  Moving slowly or fidgety/restless 0  Suicidal thoughts 0  PHQ-9 Score 14   GAD 7 : Generalized Anxiety Score 11/14/2020  Nervous, Anxious, on Edge 2  Control/stop worrying 2  Worry too much - different things 2  Trouble relaxing 2  Restless 2  Easily annoyed or irritable 2  Afraid - awful might happen 2  Total GAD 7 Score 14

## 2020-11-28 ENCOUNTER — Other Ambulatory Visit: Payer: Self-pay | Admitting: Obstetrics & Gynecology

## 2020-12-02 ENCOUNTER — Other Ambulatory Visit: Payer: Self-pay | Admitting: Obstetrics & Gynecology

## 2020-12-04 ENCOUNTER — Telehealth: Payer: Self-pay | Admitting: Clinical

## 2020-12-04 ENCOUNTER — Ambulatory Visit (INDEPENDENT_AMBULATORY_CARE_PROVIDER_SITE_OTHER): Payer: Self-pay | Admitting: Clinical

## 2020-12-04 DIAGNOSIS — F332 Major depressive disorder, recurrent severe without psychotic features: Secondary | ICD-10-CM

## 2020-12-04 NOTE — Patient Instructions (Signed)
[facility] °[address] °[place], [place] [address] °[phone] (main office) °[phone] (Osualdo Hansell's office) ° °Behavioral Health Resources:  ° °What if I or someone I know is in crisis? ° °If you are thinking about harming yourself or having thoughts of suicide, or if you know someone who is, seek help right away. ° °Call your doctor or mental health care provider. ° °Call 911 or go to a hospital emergency room to get immediate help, or ask a friend or family member to help you do these things; IF YOU ARE IN [place], YOU MAY GO TO WALK-IN URGENT CARE 24/7 at [facility] (see below) ° °Call the [place] National Suicide Prevention Lifeline’s toll-free, 24-hour hotline at 1-800-273-TALK (1-800-273-8255) or TTY: 1-800-799-4 TTY (1-800-799-4889) to talk to a trained counselor. ° °If you are in crisis, make sure you are not left alone.  ° °If someone else is in crisis, make sure he or she is not left alone ° ° °24 Hour :  ° °[facility]  °[address], [place], [place] [address] [phone] or [phone] °WALK-IN URGENT CARE 24/7 ° °[facility] Mobile Crisis: 1-877-626-1772 ° °[place] National Suicide Hotline: 1-800-273-8255 ° °[facility] Crisis Line °(Domestic Violence, Rape & Victim Assistance)  336-273-7273 ° °[facility]  °[address] [place], [place]  [address]   1-855-788-8787 or 336-676-6840  ° °[facility]: [phone] (8am-4pm) or 1-866- 261-5769 (after hours)      ° ° °[facility] °24/7 Walk-in Clinic, [address], [place], [place]  [phone] °Fax: [phone] [facility] °*Interpreters available °*Accepts all insurance and uninsured for Urgent Care needs °*Accepts Medicaid and uninsured for outpatient treatment  ° °[facility]   °Mon-Fri: 8am-5pm °[address], [place], [place] [phone](phone); [phone](fax) [email or web address]  °*Accepts Medicare ° °[facility] °Mon, Tues, Thurs, Fri: 8am-4pm °[address], [place], [place] [address] °[phone] (phone); [phone] (fax) °[facility]  °*Accepts Medicare ° °[facility] °Mon-Fri: 9am-5pm  °[address], [place], [place] °[phone] (phone); 336-540-9454  °[facility]  °*Accepts Medicaid ° °[facility] °[address], [place], [place]  °[phone] °http://[email or web address]  ° °[facility] °Mon-Fri, 8:30am-12pm/1pm-2:30pm °[address], [place], [place] [phone] (phone); [phone] (fax) °www.[facility].org  °*Accepts Medicaid, sliding-scale*Bilingual services available ° °[facility] °Mon-Fri, 8am-7pm °[address], [place], [place]  °[phone](phone); 336-899-8811(fax) °www.[facility].org  °*Accepts Medicaid *Bilingual services available ° °[facility] °Mon-Fri: 8am-5pm, Saturday by appointment only °[address], [place], [place] °[phone] (phone); [phone] (fax) °www.[facility].com  ° °[facility] °[address], [place], [place] °[phone] °www.[facility].org  °*Free & reduced services for uninsured and underinsured individuals °*Bilingual services for Spanish-speaking clients 21 and under ° °[facility] °24/7 Walk-in Clinic, [address], [place], [place] °[phone](phone); 336-676-6409(fax) °www.[facility].org  °*Bring your own interpreter at first visit °*Accepts Medicare and Medicaid ° °[facility] °Mon-Fri: 9am-5:30pm °[address], [place], [place] °[phone] (phone), [phone] (fax) °After hours crisis line:  336-763-1165 °[facility]  °*Accepts Medicare and Medicaid ° °[facility] °Mon-Thurs, 8am-6pm °[address], [place], [place]  °[phone] (phone); 336-288-0738 (fax) °[facility]://[facility]  °*Subsidized costs available ° °[facility] °Mon-Fri: 8am-4pm °[address], ,  °336-834-9664(phone); 336-834-9698(fax) °www.[facility].com  °*Accepts Medicaid ° °[facility] °Same day access hours: Mon-Fri, 8:30-3pm °Crisis hours: Mon-Fri,   hours: Mon-Fri, 8:30-3pm Crisis hours: Mon-Fri, 8am-5pm [address], [place], [place] [phone]  [facility] Same day access hours: Mon-Fri, 8:30-3pm Crisis hours: Mon-Fri, 8am-8pm [address], [place], [place] [phone] (phone); [phone] (fax) [facility]org  *Accepts Medicaid and Medicare  [facility], [place], Fri: 9am-9pm Tues, Thurs: 9am-6pm [address], [place], [place]  [phone] (phone); [phone] (fax) https://[email or web address]  *(Accepts Medicare and Medicaid; payment plans available)*Bilingual services available  [facility] [address], [place], [place] [phone] (phone); [phone] (fax) www.[facility].com   [facility] [address], [place], [place]  [phone]  RackRewards.[email or web address]  *Bilingual services available  [facility] Mon-Thurs: 8am-8pm [address], [place], [place] [phone] (phone); [phone] (fax) [email or web address]  *Accepts Medicaid*Bilingual services available  [facility] [address] [place], [place] [phone] (phone) [email or web address]  *Accepts Medicaid *Bilingual services available  [facility] Mon-Fri, 9am-4:45pm [address], [place], [place] [phone] (phone); [phone]  (fax) http://[email or web address]-[email or web address]  *Accepts Medicare  [facility] Mon-Thurs: 8:30-8pm, Fri: 8:30am-7pm [address], [place], [place] (3rd floor) [phone] (phone); [phone] (fax) [email or web address]://[email or web address]  *Accepts Medicaid; income-based reduced rates available  [facility] Mon-Fri: 8am-5pm [address], [place], [place] [phone] [phone] (phone); [phone] (fax) http://www.[email or web address]  *Accepts Medicaid*Bilingual services available   [facility] [place])  [address], [place] [phone] www.mhag.org  *Provides direct services to individuals in recovery from mental illness, including support groups, recovery skills classes, and one on one peer support  [employer](National Alliance on Mental Illness) [place] NAMI helpline: (530) [phone]  [facility] [place] helpline: [phone] https://namiguilford.org  *A community hub for information relating to local resources and services for the friends and families of individuals living alongside a mental health condition, as well as the individuals themselves. Classes and support groups also provided    Coping with Panic Attacks   What is a panic attack?  You may have had a panic attack if you experienced four or more of the symptoms listed below coming on abruptly and peaking in about 10 minutes.  Panic Symptoms   . Pounding heart  . Sweating  . Trembling or shaking  . Shortness of breath  . Feeling of choking  . Chest pain  . Nausea or abdominal distress    . Feeling dizzy, unsteady, lightheaded, or faint  . Feelings of unreality or being detached from yourself  . Fear of losing control or going crazy  . Fear of dying  . Numbness or tingling  . Chills or hot flashes      Panic attacks are sometimes accompanied by avoidance of certain places or situations. These are often situations that would be difficult to escape from or in  which help might not be available. Examples might include crowded shopping malls, public transportation, restaurants, or driving.   Why do panic attacks occur?   Panic attacks are the body's alarm system gone awry. All of us have a built-in alarm system, powered by adrenaline, which increases our heart rate, breathing, and blood flow in response to danger. Ordinarily, this 'danger response system' works well. In some people, however, the response is either out of proportion to whatever stress is going on, or may come out of the blue without any stress at all.   For example, if you are walking in the woods and see a bear coming your way, a variety of changes occur in your body to prepare you to either  to either fight the danger or flee from the situation. Your heart rate will increase to get more blood flow around your body, your breathing rate will quicken so that more oxygen is available, and your muscles will tighten in order to be ready to fight or run. You may feel nauseated as blood flow leaves your stomach area and moves into your limbs. These bodily changes are all essential to helping you survive the dangerous situation. After the danger has passed, your body functions will begin to go back to normal. This is because your body also has a system for "recovering" by bringing your body back down to a normal state when the danger is over.   As you can see, the emergency response system is adaptive when there is, in fact, a "true" or "real" danger (e.g., bear). However, sometimes people find that their emergency response system is triggered in "everyday" situations where there really is no true physical danger (e.g., in a meeting, in the grocery store, while driving in normal traffic, etc.).   What triggers a panic attack?  Sometimes particularly stressful situations can trigger a panic attack. For example, an argument with your spouse or stressors at work can cause a stress response (activating the emergency  response system) because you perceive it as threatening or overwhelming, even if there is no direct risk to your survival.  Sometimes panic attacks don't seem to be triggered by anything in particular- they may "come out of the blue". Somehow, the natural "fight or flight" emergency response system has gotten activated when there is no real danger. Why does the body go into "emergency mode" when there is no real danger?   Often, people with panic attacks are frightened or alarmed by the physical sensations of the emergency response system. First, unexpected physical sensations are experienced (tightness in your chest or some shortness of breath). This then leads to feeling fearful or alarmed by these symptoms ("Something's wrong!", "Am I having a heart attack?", "Am I going to faint?") The mind perceives that there is a danger even though no real danger exists. This, in turn, activates the emergency response system ("fight or flight"), leading to a "full blown" panic attack. In summary, panic attacks occur when we misinterpret physical symptoms as signs of impending death, craziness, loss of control, embarrassment, or fear of fear. Sometimes you may be aware of thoughts of danger that activate the emergency response system (for example, thinking "I'm having a heart attack" when you feel chest pressure or increased heart rate). At other times, however, you may not be aware of such thoughts. After several incidences of being afraid of physical sensations, anxiety and panic can occur in response to the initial sensations without conscious thoughts of danger. Instead, you just feel afraid or alarmed. In other words, the panic or fear may seem to occur "automatically" without you consciously telling yourself anything.   After having had one or more panic attacks, you may also become more focused on what is going on inside your body. You may scan your body and be more vigilant about noticing any symptoms that might  signal the start of a panic attack. This makes it easier for panic attacks to happen again because you pick up on sensations you might otherwise not have noticed, and misinterpret them as something dangerous. A panic attack may then result.      How do I cope with panic attacks?  An important part of overcoming panic attacks involves re-interpreting your   body's physical reactions and teaching yourself ways to decrease the physical arousal. This can be done through practicing the cognitive and behavioral interventions below.   Research has found that over half of people who have panic attacks show some signs of hyperventilation or overbreathing. This can produce initial sensations that alarm you and lead to a panic attack. Overbreathing can also develop as part of the panic attack and make the symptoms worse. When people hyperventilate, certain blood vessels in the body become narrower. In particular, the brain may get slightly less oxygen. This can lead to the symptoms of dizziness, confusion, and lightheadedness that often occur during panic attacks. Other parts of the body may also get a bit less oxygen, which may lead to numbness or tingling in the hands or feet or the sensation of cold, clammy hands. It also may lead the heart to pump harder. Although these symptoms may be frightening and feel unpleasant, it is important to remember that hyperventilating is not dangerous. However, you can help overcome the unpleasantness of overbreathing by practicing Breathing Retraining.   Practice this basic technique three times a day, every day:  . Inhale. With your shoulders relaxed, inhale as slowly and deeply as you can while you count to six. If you can, use your diaphragm to fill your lungs with air.  . Hold. Keep the air in your lungs as you slowly count to four.  . Exhale. Slowly breath out as you count to six.  . Repeat. Do the inhale-hold-exhale cycle several times. Each time you do it, exhale for  longer counts.  Like any new skill, Breathing Retraining requires practice. Try practicing this skill twice a day for several minutes. Initially, do not try this technique in specific situations or when you become frightened or have a panic attack. Begin by practicing in a quiet environment to build up your skill level so that you can later use it in time of "emergency."   2. Decreasing Avoidance  Regardless of whether you can identify why you began having panic attacks or whether they seemed to come out of the blue, the places where you began having panic attacks often can become triggers themselves. It is not uncommon for individuals to begin to avoid the places where they have had panic attacks. Over time, the individual may begin to avoid more and more places, thereby decreasing their activities and often negatively impacting their quality of life. To break the cycle of avoidance, it is important to first identify the places or situations that are being avoided, and then to do some "relearning."  To begin this intervention, first create a list of locations or situations that you tend to avoid. Then choose an avoided location or situation that you would like to target first. Now develop an "exposure hierarchy" for this situation or location. An "exposure hierarchy" is a list of actions that make you feel anxious in this situation. Order these actions from least to most anxiety-producing. It is often helpful to have the first item on your hierarchy involve thinking or imagining part of the feared/avoided situation.   Here is an example of an exposure hierarchy for decreasing avoidance of the grocery store. Note how it is ordered from the least amount of anxiety (at the top) to the most anxiety (at the bottom):  . Think about going to the grocery store alone.  . Go to the grocery store with a friend or family member.  . Go to the grocery store alone to   a few small items (5-10 minutes in the  store).  . Shopping for 10-20 minutes in the store alone.  . Doing the shopping for the week by myself (20-30 minutes in the store).   Your homework is to "expose" yourself to the lowest item on your hierarchy and use your breathing relaxation and coping statements (see below) to help you remain in the situation. Practice this several times during the upcoming week. Once you have mastered each item with minimal anxiety, move on to the next higher action on your list.   Cognitive Interventions  1. Identify your negative self-talk Anxious thoughts can increase anxiety symptoms and panic. The first step in changing anxious thinking is to identify your own negative, alarming self-talk. Some common alarming thoughts:  . I'm having a heart attack.            . I must be going crazy. . I think I'm dying. Marland Kitchen. People will think I'm crazy. . I'm going to pass our.  . Oh no- here it comes.  . I can't stand this.  Peggye Form. I've got to get out of here!  2. Use positive coping statements Changing or disrupting a pattern of anxious thoughts by replacing them with more calming or supportive statements can help to divert a panic attack. Some common helpful coping statements:  . This is not an emergency.  . I don't like feeling this way, but I can accept it.  . I can feel like this and still be okay.  . This has happened before, and I was okay. I'll be okay this time, too.  . I can be anxious and still deal with this situation.   /Emotional Wellbeing Apps and Websites Here are a few free apps meant to help you to help yourself.  To find, try searching on the internet to see if the app is offered on Apple/Android devices. If your first choice doesn't come up on your device, the good news is that there are many choices! Play around with different apps to see which ones are helpful to you.    Calm This is an app meant to help increase calm feelings. Includes info, strategies, and tools for tracking your  feelings.      Calm Harm  This app is meant to help with self-harm. Provides many 5-minute or 15-min coping strategies for doing instead of hurting yourself.       Healthy Minds Health Minds is a problem-solving tool to help deal with emotions and cope with stress you encounter wherever you are.      MindShift This app can help people cope with anxiety. Rather than trying to avoid anxiety, you can make an important shift and face it.      MY3  MY3 features a support system, safety plan and resources with the goal of offering a tool to use in a time of need.       My Life My Voice  This mood journal offers a simple solution for tracking your thoughts, feelings and moods. Animated emoticons can help identify your mood.       Relax Melodies Designed to help with sleep, on this app you can mix sounds and meditations for relaxation.      Smiling Mind Smiling Mind is meditation made easy: it's a simple tool that helps put a smile on your mind.        Stop, Breathe & Think  A friendly, simple guide for people through meditations for  mindfulness and compassion.  Stop, Breathe and Think Kids Enter your current feelings and choose a "mission" to help you cope. Offers videos for certain moods instead of just sound recordings.       Team Orange The goal of this tool is to help teens change how they think, act, and react. This app helps you focus on your own good feelings and experiences.      The United [employer] Box The [employer] Station[employer] Box (VHB) contains simple tools to help patients with coping, relaxation, distraction, and positive thinking.

## 2020-12-04 NOTE — Telephone Encounter (Signed)
Attempt to f/u after virtual visit this morning; Left HIPPA-compliant message to call back Asher Muir from [employer] for [employer] at [facility] at  [phone] [facility] office).

## 2020-12-10 NOTE — BH Specialist Note (Signed)
Integrated Behavioral Health via Telemedicine Visit  12/10/2020 Lori Tran [id number]  Pt did not arrive to video visit and did not answer the phone ; Left HIPPA-compliant message to call back Asher Muir from [facility] for [employer] at [facility] at [phone] (main office) or [phone] (Lizandra Zakrzewski's office).  ; left MyChart message for patient.    Valetta Close Kayra Crowell, LCSW

## 2020-12-11 ENCOUNTER — Ambulatory Visit: Payer: Self-pay | Admitting: Clinical

## 2020-12-11 DIAGNOSIS — Z91199 Patient's noncompliance with other medical treatment and regimen due to unspecified reason: Secondary | ICD-10-CM

## 2020-12-11 DIAGNOSIS — Z5329 Procedure and treatment not carried out because of patient's decision for other reasons: Secondary | ICD-10-CM

## 2020-12-19 ENCOUNTER — Encounter (HOSPITAL_BASED_OUTPATIENT_CLINIC_OR_DEPARTMENT_OTHER): Payer: Self-pay | Admitting: Obstetrics & Gynecology

## 2020-12-19 ENCOUNTER — Other Ambulatory Visit: Payer: Self-pay

## 2020-12-19 NOTE — Progress Notes (Signed)
Spoke w/ via phone for pre-op interview---pt Lab needs dos----   Cbc T & S , urine preg            Lab results------ekg 10-24-2020 , ekg 10-17-2020, chest xray 10-17-2020 epic COVID test ------12-20-2020 1400 Arrive at -------530 am 12-24-2020 NPO after MN NO Solid Food.  Clear liquids from MN until---430 am then npo Medications to take morning of surgery -----megace Diabetic medication -----n/a Patient Special Instructions -----none Pre-Op special Istructions -----none Patient verbalized understanding of instructions that were given at this phone interview. Patient denies shortness of breath, chest pain, fever, cough at this phone interview.

## 2020-12-20 ENCOUNTER — Other Ambulatory Visit (HOSPITAL_COMMUNITY)
Admission: RE | Admit: 2020-12-20 | Discharge: 2020-12-20 | Disposition: A | Discharge status: Home / Self Care | Payer: Self-pay | Source: Ambulatory Visit | Attending: Obstetrics & Gynecology | Admitting: Obstetrics & Gynecology

## 2020-12-20 DIAGNOSIS — Z01812 Encounter for preprocedural laboratory examination: Secondary | ICD-10-CM | POA: Insufficient documentation

## 2020-12-20 DIAGNOSIS — Z20822 Contact with and (suspected) exposure to covid-19: Secondary | ICD-10-CM | POA: Insufficient documentation

## 2020-12-21 LAB — SARS CORONAVIRUS 2 (TAT 6-24 HRS): SARS Coronavirus 2: NEGATIVE

## 2020-12-23 NOTE — Anesthesia Preprocedure Evaluation (Addendum)
Anesthesia Evaluation  Patient identified by MRN, date of birth, ID band Patient awake    Reviewed: Allergy & Precautions, NPO status , Patient's Chart, lab work & pertinent test results  Airway Mallampati: I  TM Distance: >3 FB Neck ROM: Full    Dental no notable dental hx. (+) Teeth Intact, Dental Advisory Given   Pulmonary neg pulmonary ROS, former smoker,    Pulmonary exam normal breath sounds clear to auscultation       Cardiovascular Exercise Tolerance: Good negative cardio ROS Normal cardiovascular exam Rhythm:Regular Rate:Normal     Neuro/Psych  Headaches, negative psych ROS   GI/Hepatic Neg liver ROS,   Endo/Other  negative endocrine ROS  Renal/GU      Musculoskeletal negative musculoskeletal ROS (+)   Abdominal (+) + obese,   Peds  Hematology  (+) anemia ,   Anesthesia Other Findings   Reproductive/Obstetrics negative OB ROS Hx of PCOS                            Anesthesia Physical Anesthesia Plan  ASA: II  Anesthesia Plan: General   Post-op Pain Management:    Induction: Intravenous  PONV Risk Score and Plan: Treatment may vary due to age or medical condition, Midazolam, Dexamethasone and Ondansetron  Airway Management Planned: LMA  Additional Equipment: None  Intra-op Plan:   Post-operative Plan:   Informed Consent: I have reviewed the patients History and Physical, chart, labs and discussed the procedure including the risks, benefits and alternatives for the proposed anesthesia with the patient or authorized representative who has indicated his/her understanding and acceptance.     Dental advisory given  Plan Discussed with: CRNA and Anesthesiologist  Anesthesia Plan Comments: (LMA)       Anesthesia Quick Evaluation

## 2020-12-24 ENCOUNTER — Ambulatory Visit (HOSPITAL_BASED_OUTPATIENT_CLINIC_OR_DEPARTMENT_OTHER): Payer: Self-pay | Admitting: Anesthesiology

## 2020-12-24 ENCOUNTER — Encounter (HOSPITAL_BASED_OUTPATIENT_CLINIC_OR_DEPARTMENT_OTHER): Payer: Self-pay | Admitting: Obstetrics & Gynecology

## 2020-12-24 ENCOUNTER — Inpatient Hospital Stay (HOSPITAL_COMMUNITY): Admission: RE | Admit: 2020-12-24 | Payer: Self-pay | Source: Ambulatory Visit

## 2020-12-24 ENCOUNTER — Encounter (HOSPITAL_BASED_OUTPATIENT_CLINIC_OR_DEPARTMENT_OTHER)
Admission: RE | Disposition: A | Discharge status: Home / Self Care | Payer: Self-pay | Source: Ambulatory Visit | Attending: Obstetrics & Gynecology

## 2020-12-24 ENCOUNTER — Other Ambulatory Visit: Payer: Self-pay

## 2020-12-24 ENCOUNTER — Ambulatory Visit (HOSPITAL_BASED_OUTPATIENT_CLINIC_OR_DEPARTMENT_OTHER)
Admission: RE | Admit: 2020-12-24 | Discharge: 2020-12-24 | Disposition: A | Discharge status: Home / Self Care | Payer: Self-pay | Source: Ambulatory Visit | Attending: Obstetrics & Gynecology | Admitting: Obstetrics & Gynecology

## 2020-12-24 DIAGNOSIS — Z8616 Personal history of COVID-19: Secondary | ICD-10-CM | POA: Insufficient documentation

## 2020-12-24 DIAGNOSIS — R9389 Abnormal findings on diagnostic imaging of other specified body structures: Secondary | ICD-10-CM

## 2020-12-24 DIAGNOSIS — Z833 Family history of diabetes mellitus: Secondary | ICD-10-CM | POA: Insufficient documentation

## 2020-12-24 DIAGNOSIS — Z87891 Personal history of nicotine dependence: Secondary | ICD-10-CM | POA: Insufficient documentation

## 2020-12-24 DIAGNOSIS — N841 Polyp of cervix uteri: Secondary | ICD-10-CM

## 2020-12-24 DIAGNOSIS — N938 Other specified abnormal uterine and vaginal bleeding: Secondary | ICD-10-CM

## 2020-12-24 DIAGNOSIS — Z79899 Other long term (current) drug therapy: Secondary | ICD-10-CM | POA: Insufficient documentation

## 2020-12-24 HISTORY — PX: HYSTEROSCOPY WITH D & C: SHX1775

## 2020-12-24 HISTORY — DX: Presence of spectacles and contact lenses: Z97.3

## 2020-12-24 HISTORY — DX: Abnormal findings on diagnostic imaging of other specified body structures: R93.89

## 2020-12-24 HISTORY — DX: Headache, unspecified: R51.9

## 2020-12-24 HISTORY — DX: Anemia, unspecified: D64.9

## 2020-12-24 LAB — TYPE AND SCREEN
ABO/RH(D): A POS
Antibody Screen: NEGATIVE

## 2020-12-24 LAB — ABO/RH: ABO/RH(D): A POS

## 2020-12-24 LAB — CBC
HCT: 36.9 % (ref 36.0–46.0)
Hemoglobin: 10.9 g/dL — ABNORMAL LOW (ref 12.0–15.0)
MCH: 20.5 pg — ABNORMAL LOW (ref 26.0–34.0)
MCHC: 29.5 g/dL — ABNORMAL LOW (ref 30.0–36.0)
MCV: 69.2 fL — ABNORMAL LOW (ref 80.0–100.0)
Platelets: 247 10*3/uL (ref 150–400)
RBC: 5.33 MIL/uL — ABNORMAL HIGH (ref 3.87–5.11)
RDW: 25.4 % — ABNORMAL HIGH (ref 11.5–15.5)
WBC: 9.2 10*3/uL (ref 4.0–10.5)
nRBC: 0 % (ref 0.0–0.2)

## 2020-12-24 LAB — POCT PREGNANCY, URINE: Preg Test, Ur: NEGATIVE

## 2020-12-24 SURGERY — DILATATION AND CURETTAGE /HYSTEROSCOPY
Anesthesia: General | Site: Vagina

## 2020-12-24 MED ORDER — ONDANSETRON HCL 4 MG/2ML IJ SOLN
INTRAMUSCULAR | Status: DC | PRN
  Administered 2020-12-24: 4 mg via INTRAVENOUS

## 2020-12-24 MED ORDER — ONDANSETRON HCL 4 MG/2ML IJ SOLN
INTRAMUSCULAR | Status: AC
  Filled 2020-12-24: qty 2

## 2020-12-24 MED ORDER — MIDAZOLAM HCL 2 MG/2ML IJ SOLN
INTRAMUSCULAR | Status: DC | PRN
  Administered 2020-12-24: 2 mg via INTRAVENOUS

## 2020-12-24 MED ORDER — ARTIFICIAL TEARS OPHTHALMIC OINT
TOPICAL_OINTMENT | OPHTHALMIC | Status: AC
  Filled 2020-12-24: qty 3.5

## 2020-12-24 MED ORDER — LIDOCAINE-EPINEPHRINE 1 %-1:100000 IJ SOLN
INTRAMUSCULAR | Status: AC
  Filled 2020-12-24: qty 1

## 2020-12-24 MED ORDER — DEXAMETHASONE SODIUM PHOSPHATE 10 MG/ML IJ SOLN
INTRAMUSCULAR | Status: AC
  Filled 2020-12-24: qty 1

## 2020-12-24 MED ORDER — KETOROLAC TROMETHAMINE 30 MG/ML IJ SOLN
INTRAMUSCULAR | Status: AC
  Filled 2020-12-24: qty 1

## 2020-12-24 MED ORDER — HYDROCODONE-ACETAMINOPHEN 5-325 MG PO TABS: 1.0000 | ORAL_TABLET | Freq: Four times a day (QID) | ORAL | 0 refills | Status: DC | PRN

## 2020-12-24 MED ORDER — MIDAZOLAM HCL 2 MG/2ML IJ SOLN
INTRAMUSCULAR | Status: AC
  Filled 2020-12-24: qty 2

## 2020-12-24 MED ORDER — LIDOCAINE-EPINEPHRINE 1 %-1:100000 IJ SOLN
INTRAMUSCULAR | Status: DC | PRN
  Administered 2020-12-24: 10 mL

## 2020-12-24 MED ORDER — AMISULPRIDE (ANTIEMETIC) 5 MG/2ML IV SOLN: 10.0000 mg | Freq: Once | INTRAVENOUS | Status: DC | PRN

## 2020-12-24 MED ORDER — OXYCODONE HCL 5 MG/5ML PO SOLN: 5.0000 mg | Freq: Once | ORAL | Status: DC | PRN

## 2020-12-24 MED ORDER — LACTATED RINGERS IV SOLN: INTRAVENOUS | Status: DC

## 2020-12-24 MED ORDER — IBUPROFEN 800 MG PO TABS
800.0000 mg | ORAL_TABLET | Freq: Three times a day (TID) | ORAL | 0 refills | Status: DC | PRN
Start: 2020-12-24 — End: 2021-03-26

## 2020-12-24 MED ORDER — DEXAMETHASONE SODIUM PHOSPHATE 10 MG/ML IJ SOLN
INTRAMUSCULAR | Status: DC | PRN
  Administered 2020-12-24: 10 mg via INTRAVENOUS

## 2020-12-24 MED ORDER — PROPOFOL 10 MG/ML IV BOLUS
INTRAVENOUS | Status: DC | PRN
  Administered 2020-12-24: 150 mg via INTRAVENOUS
  Administered 2020-12-24: 30 mg via INTRAVENOUS

## 2020-12-24 MED ORDER — FENTANYL CITRATE (PF) 100 MCG/2ML IJ SOLN
INTRAMUSCULAR | Status: AC
  Filled 2020-12-24: qty 2

## 2020-12-24 MED ORDER — LIDOCAINE HCL (PF) 2 % IJ SOLN
INTRAMUSCULAR | Status: AC
  Filled 2020-12-24: qty 5

## 2020-12-24 MED ORDER — ONDANSETRON HCL 4 MG/2ML IJ SOLN
4.0000 mg | Freq: Once | INTRAMUSCULAR | Status: AC | PRN
  Administered 2020-12-24: 4 mg via INTRAVENOUS

## 2020-12-24 MED ORDER — KETOROLAC TROMETHAMINE 30 MG/ML IJ SOLN: 30.0000 mg | Freq: Once | INTRAMUSCULAR | Status: DC | PRN

## 2020-12-24 MED ORDER — OXYCODONE HCL 5 MG PO TABS: 5.0000 mg | ORAL_TABLET | Freq: Once | ORAL | Status: DC | PRN

## 2020-12-24 MED ORDER — SODIUM CHLORIDE 0.9 % IR SOLN
Status: DC | PRN
  Administered 2020-12-24: 3000 mL

## 2020-12-24 MED ORDER — SILVER NITRATE-POT NITRATE 75-25 % EX MISC
CUTANEOUS | Status: AC
  Filled 2020-12-24: qty 10

## 2020-12-24 MED ORDER — LIDOCAINE 2% (20 MG/ML) 5 ML SYRINGE
INTRAMUSCULAR | Status: DC | PRN
  Administered 2020-12-24: 100 mg via INTRAVENOUS

## 2020-12-24 MED ORDER — HYDROMORPHONE HCL 1 MG/ML IJ SOLN
0.2500 mg | INTRAMUSCULAR | Status: DC | PRN
Start: 2020-12-24 — End: 2020-12-24

## 2020-12-24 MED ORDER — PROPOFOL 10 MG/ML IV BOLUS
INTRAVENOUS | Status: AC
  Filled 2020-12-24: qty 40

## 2020-12-24 MED ORDER — KETOROLAC TROMETHAMINE 30 MG/ML IJ SOLN
INTRAMUSCULAR | Status: DC | PRN
  Administered 2020-12-24: 30 mg via INTRAVENOUS

## 2020-12-24 MED ORDER — FENTANYL CITRATE (PF) 100 MCG/2ML IJ SOLN
INTRAMUSCULAR | Status: DC | PRN
  Administered 2020-12-24 (×2): 50 ug via INTRAVENOUS

## 2020-12-24 SURGICAL SUPPLY — 22 items
BIPOLAR CUTTING LOOP 21FR (ELECTRODE)
CANISTER SUCT 3000ML PPV (MISCELLANEOUS) IMPLANT
CATH ROBINSON RED A/P 16FR (CATHETERS) ×3 IMPLANT
COVER WAND RF STERILE (DRAPES) ×3 IMPLANT
DEVICE MYOSURE LITE (MISCELLANEOUS) ×3 IMPLANT
DEVICE MYOSURE REACH (MISCELLANEOUS) IMPLANT
DILATOR CANAL MILEX (MISCELLANEOUS) IMPLANT
GAUZE 4X4 16PLY RFD (DISPOSABLE) ×3 IMPLANT
GLOVE SURG LTX SZ6.5 (GLOVE) ×6 IMPLANT
GLOVE SURG UNDER POLY LF SZ7 (GLOVE) ×3 IMPLANT
GOWN STRL REUS W/TWL LRG LVL3 (GOWN DISPOSABLE) ×6 IMPLANT
IV NS IRRIG 3000ML ARTHROMATIC (IV SOLUTION) ×3 IMPLANT
KIT PROCEDURE FLUENT (KITS) ×3 IMPLANT
KIT TURNOVER CYSTO (KITS) ×3 IMPLANT
LOOP CUTTING BIPOLAR 21FR (ELECTRODE) IMPLANT
PACK VAGINAL MINOR WOMEN LF (CUSTOM PROCEDURE TRAY) ×3 IMPLANT
PAD OB MATERNITY 4.3X12.25 (PERSONAL CARE ITEMS) ×3 IMPLANT
SEAL CERVICAL OMNI LOK (ABLATOR) IMPLANT
SEAL ROD LENS SCOPE MYOSURE (ABLATOR) ×3 IMPLANT
SYR CONTROL 10ML LL (SYRINGE) ×3 IMPLANT
TOWEL OR 17X26 10 PK STRL BLUE (TOWEL DISPOSABLE) ×3 IMPLANT
WATER STERILE IRR 500ML POUR (IV SOLUTION) ×3 IMPLANT

## 2020-12-24 NOTE — Anesthesia Procedure Notes (Signed)
Procedure Name: LMA Insertion Date/Time: 12/24/2020 7:35 AM Performed by: Norva Pavlov, CRNA Pre-anesthesia Checklist: Patient identified, Emergency Drugs available, Suction available and Patient being monitored Patient Re-evaluated:Patient Re-evaluated prior to induction Oxygen Delivery Method: Circle system utilized Preoxygenation: Pre-oxygenation with 100% oxygen Induction Type: IV induction Ventilation: Mask ventilation without difficulty LMA: LMA inserted LMA Size: 4.0 Number of attempts: 1 Airway Equipment and Method: Bite block Placement Confirmation: positive ETCO2 Tube secured with: Tape Dental Injury: Teeth and Oropharynx as per pre-operative assessment

## 2020-12-24 NOTE — Anesthesia Postprocedure Evaluation (Signed)
Anesthesia Post Note  Patient: Lori Tran  Procedure(s) Performed: DILATATION AND CURETTAGE /HYSTEROSCOPY (N/A Uterus) pap smear (N/A Vagina )     Patient location during evaluation: PACU Anesthesia Type: General Level of consciousness: awake and alert Pain management: pain level controlled Vital Signs Assessment: post-procedure vital signs reviewed and stable Respiratory status: spontaneous breathing, nonlabored ventilation, respiratory function stable and patient connected to nasal cannula oxygen Cardiovascular status: blood pressure returned to baseline and stable Postop Assessment: no apparent nausea or vomiting Anesthetic complications: no   No complications documented.  Last Vitals:  Vitals:   12/24/20 0830 12/24/20 0845  BP: 118/64 128/80  Pulse: 71 80  Resp: 16 19  Temp: 36.8 C   SpO2: 97% 99%    Last Pain:  Vitals:   12/24/20 0830  TempSrc: Oral  PainSc:                  Trevor Iha

## 2020-12-24 NOTE — H&P (Signed)
Lori Tran is an 30 y.o. female G0 with h/o irregular and heavy bleeding that has been occurring for almost 8 years.  Recent ultrasound showed thickened endometrium up to 16 mm and she has a prolapsed polyp that was noted on physical exam.  Hysteroscopy discussed with pt with polyp removal.  There really is no other alternative to this procedure.  She was started on megace as well.  Bleeding is now just spotting.  Procedure reviewed with patient.  Recovery and pain management discussed.  Risks discussed including but not limited to bleeding, rare risk of transfusion, infection, 1% risk of uterine perforation with risks of fluid deficit causing cardiac arrythmia, cerebral swelling and/or need to stop procedure early.  Fluid emboli and rare risk of death discussed.  DVT/PE, rare risk of risk of bowel/bladder/ureteral/vascular injury.  Patient aware if pathology abnormal she may need additional treatment.  All questions answered.    `Pertinent Gynecological History: Menses: irregular and heavy Contraception: abstinence DES exposure: denies Blood transfusions: none Sexually transmitted diseases: no past history Previous GYN Procedures: nnoen  Last mammogram: no due to age  Last pap: normal Date: pt unsure OB History: G0, P0   Menstrual History: Patient's last menstrual period was 11/23/2020.    Past Medical History:  Diagnosis Date  . Anemia   . COVID 11/2018   sob, diarrhea fever loss of taste and smell weakness x 1 month all symptoms resolved  . Headache   . PCOS (polycystic ovarian syndrome)   . Thickened endometrium   . Wears glasses     Past Surgical History:  Procedure Laterality Date  . NO PAST SURGERIES      Family History  Problem Relation Age of Onset  . Diabetes Mother     Social History:  reports that she has quit smoking. She has never used smokeless tobacco. She reports previous alcohol use. She reports that she does not use drugs.  Allergies: No Known  Allergies  Medications Prior to Admission  Medication Sig Dispense Refill Last Dose  . ferrous sulfate 325 (65 FE) MG tablet Take 1 tab every other day with OJ. 30 tablet 0 12/23/2020 at Unknown time  . megestrol (MEGACE) 40 MG tablet Take 1 tablet (40 mg total) by mouth 2 (two) times daily. 60 tablet 1 12/24/2020 at Unknown time    Review of Systems  All other systems reviewed and are negative.   Blood pressure (!) 145/85, pulse 80, temperature 98.5 F (36.9 C), temperature source Oral, resp. rate 18, height 5\' 1"  (1.549 m), weight 89.8 kg, last menstrual period 11/23/2020, SpO2 97 %. Physical Exam Constitutional:      Appearance: Normal appearance.  Cardiovascular:     Rate and Rhythm: Normal rate and regular rhythm.     Pulses: Normal pulses.  Pulmonary:     Effort: Pulmonary effort is normal.     Breath sounds: Normal breath sounds.  Neurological:     General: No focal deficit present.     Mental Status: She is alert.  Psychiatric:        Mood and Affect: Mood normal.     Results for orders placed or performed during the hospital encounter of 12/24/20 (from the past 24 hour(s))  Pregnancy, urine POC     Status: None   Collection Time: 12/24/20  5:35 AM  Result Value Ref Range   Preg Test, Ur NEGATIVE NEGATIVE  CBC     Status: Abnormal   Collection Time: 12/24/20  6:09 AM  Result Value Ref Range   WBC 9.2 4.0 - 10.5 K/uL   RBC 5.33 (H) 3.87 - 5.11 MIL/uL   Hemoglobin 10.9 (L) 12.0 - 15.0 g/dL   HCT 79.3 90.3 - 00.9 %   MCV 69.2 (L) 80.0 - 100.0 fL   MCH 20.5 (L) 26.0 - 34.0 pg   MCHC 29.5 (L) 30.0 - 36.0 g/dL   RDW 23.3 (H) 00.7 - 62.2 %   Platelets 247 150 - 400 K/uL   nRBC 0.0 0.0 - 0.2 %    No results found.  Assessment/Plan: 30 yo GO Single Hispanic female here for hysteroscopy with polyp removal, D&C due to irregular and heavy bleeding, h/o anemia, prolapsed endometrial polyp.  Procedure reviewed.  Risks and benefits discussed.  Pt ready to proceed.  Jerene Bears 12/24/2020, 7:12 AM

## 2020-12-24 NOTE — Op Note (Signed)
12/24/2020  8:28 AM  PATIENT:  Lori Tran  30 y.o. female  PRE-OPERATIVE DIAGNOSIS:  Thickened Endometrium Polyp AUB  POST-OPERATIVE DIAGNOSIS:  Thickened Endometrium Polyp AUB  PROCEDURE:  Procedure(s): DILATATION AND CURETTAGE /HYSTEROSCOPY WITH PAP SMEAR  SURGEON:  Jerene Bears  ASSISTANTS: OR staff  ANESTHESIA:   general  ESTIMATED BLOOD LOSS: 20 mL  BLOOD ADMINISTERED:none   FLUIDS: 1000cc LR  UOP: 30cc concentrated urine  SPECIMEN:  Endometrial polyp and curetting, pap smear  DISPOSITION OF SPECIMEN:  PATHOLOGY  FINDINGS: large prolapsed polyp through cervix, thickened endometrium, normal uterus that is mobile on bimanual exam  DESCRIPTION OF OPERATION: Patient was taken to the operating room.  She is placed in the supine position. SCDs were on her lower extremities and functioning properly. General anesthesia with an LMA was administered without difficulty. Dr. Collins Scotland, anesthesia, oversaw case.  Legs were then placed in the [facility] stirrups in the low lithotomy position. The legs were lifted to the high lithotomy position and the Betadine prep was used on the inner thighs perineum and vagina x3. Patient was draped in a normal standard fashion. An in and out catheterization with a red rubber Foley catheter was performed. Approximately 30 cc of clear urine was noted. A bivalve speculum was placed the vagina. The anterior lip of the cervix was grasped with single-tooth tenaculum.  A paracervical block of 1% lidocaine mixed one-to-one with epinephrine (1:100,000 units).  10 cc was used total. The cervix did not require dilation likely due to the prolapse polyp.  A #19 Pratt dilator was easily passed through the os. The endometrial cavity sounded to 8 cm.  A polyp forceps was obtained and the visible polyp was easily removed.   A 2.9 millimeter diagnostic hysteroscope was obtained. Normal saline was used as a hysteroscopic fluid. The hysteroscope was advanced  through the endocervical canal into the endometrial cavity. The tubal ostia were noted bilaterally. Additional findings included thickened endometrium and a sizeable portion of polyp that was stil present.  The hysteroscope was removed.  Using a polyp forceps, the polyp was grasped multiple times to try and get it much smaller.  Then a A #1 smooth curette was used to curette the cavity until rough gritty texture was noted in all quadrants. With revisualization of the hysteroscope, there was still a portion of polyp left.  Using the Myosure lite device, the remaining polyp was resected.  The hysteroscope was removed.  A final curette was performed with minimal tissue obtained. The fluid deficit was 350 cc NS. The tenaculum was removed from the anterior lip of the cervix. The speculum was removed from the vagina. The prep was cleansed of the patient's skin. The legs are positioned back in the supine position. Sponge, lap, needle, initially counts were correct x2. Patient was taken to recovery in stable condition.  COUNTS:  YES  PLAN OF CARE: Transfer to PACU

## 2020-12-24 NOTE — Transfer of Care (Signed)
Immediate Anesthesia Transfer of Care Note  Patient: Lori Tran  Procedure(s) Performed: Procedure(s) (LRB): DILATATION AND CURETTAGE /HYSTEROSCOPY (N/A) pap smear (N/A)  Patient Location: PACU  Anesthesia Type: General  Level of Consciousness: awake, alert  and oriented  Airway & Oxygen Therapy: Patient Spontanous Breathing and Patient connected to nasal cannula oxygen  Post-op Assessment: Report given to PACU RN and Post -op Vital signs reviewed and stable  Post vital signs: Reviewed and stable  Complications: No apparent anesthesia complications  Last Vitals:  Vitals Value Taken Time  BP 118/64 12/24/20 0830  Temp 36.8 C 12/24/20 0830  Pulse 70 12/24/20 0830  Resp 18 12/24/20 0830  SpO2 96 % 12/24/20 0830  Vitals shown include unvalidated device data.  Last Pain:  Vitals:   12/24/20 0830  TempSrc: Oral  PainSc:          Complications: No complications documented.

## 2020-12-24 NOTE — Discharge Instructions (Addendum)
Post Anesthesia Home Care Instructions  Activity: Get plenty of rest for the remainder of the day. A responsible adult should stay with you for 24 hours following the procedure.  For the next 24 hours, DO NOT: -Drive a car -Advertising copywriter -Drink alcoholic beverages -Take any medication unless instructed by your physician -Make any legal decisions or sign important papers.  Meals: Start with liquid foods such as gelatin or soup. Progress to regular foods as tolerated. Avoid greasy, spicy, heavy foods. If nausea and/or vomiting occur, drink only clear liquids until the nausea and/or vomiting subsides. Call your physician if vomiting continues.  Special Instructions/Symptoms: Your throat may feel dry or sore from the anesthesia or the breathing tube placed in your throat during surgery. If this causes discomfort, gargle with warm salt water. The discomfort should disappear within 24 hours.  If you had a scopolamine patch placed behind your ear for the management of post- operative nausea and/or vomiting:  1. The medication in the patch is effective for 72 hours, after which it should be removed.  Wrap patch in a tissue and discard in the trash. Wash hands thoroughly with soap and water. 2. You may remove the patch earlier than 72 hours if you experience unpleasant side effects which may include dry mouth, dizziness or visual disturbances. 3. Avoid touching the patch. Wash your hands with soap and water after contact with the patch.   Post-surgical Instructions, Outpatient Surgery  You may expect to feel dizzy, weak, and drowsy for as long as 24 hours after receiving the medicine that made you sleep (anesthetic). For the first 24 hours after your surgery:    Do not drive a car, ride a bicycle, participate in physical activities, or take public transportation until you are done taking narcotic pain medicines or as directed by Dr. Hyacinth Meeker.   Do not drink alcohol or take tranquilizers.    Do not take medicine that has not been prescribed by your physicians.   Do not sign important papers or make important decisions while on narcotic pain medicines.   Have a responsible person with you.   PAIN MANAGEMENT  Motrin 800mg .  (This is the same as 4-200mg  over the counter tablets of Motrin or ibuprofen.)  You may take this every eight hours or as needed for cramping.    Vicodin 5/325mg .  For more severe pain, take one every six hours as needed for pain control.  (Remember that narcotic pain medications increase your risk of constipation.  If this becomes a problem, you may take an over the counter stool softener like Colace 100mg  up to four times a day.)  DO'S AND DON'T'S  Do not take a tub bath for one week.  You may shower on the first day after your surgery  Do not do any heavy lifting for one to two weeks.  This increases the chance of bleeding.  Do move around as you feel able.  Stairs are fine.  You may begin to exercise again as you feel able.  Do not lift any weights for two weeks.  Do not put anything in the vagina for two weeks--no tampons, intercourse, or douching.    REGULAR MEDIATIONS/VITAMINS:  You may restart all of your regular medications as prescribed.  You may restart all of your vitamins as you normally take them.    PLEASE CALL OR SEEK MEDICAL CARE IF:  You have persistent nausea and vomiting.   You have trouble eating or drinking.   You  have an oral temperature above 100.5.   You have constipation that is not helped by adjusting diet or increasing fluid intake. Pain medicines are a common cause of constipation.   You have heavy vaginal bleeding

## 2020-12-25 ENCOUNTER — Encounter (HOSPITAL_BASED_OUTPATIENT_CLINIC_OR_DEPARTMENT_OTHER): Payer: Self-pay | Admitting: Obstetrics & Gynecology

## 2020-12-25 LAB — SURGICAL PATHOLOGY

## 2020-12-26 LAB — CYTOLOGY - PAP: Diagnosis: NEGATIVE

## 2020-12-27 ENCOUNTER — Emergency Department (HOSPITAL_COMMUNITY)
Admission: EM | Admit: 2020-12-27 | Discharge: 2020-12-29 | Disposition: A | Discharge status: Home / Self Care | Payer: Self-pay | Attending: Emergency Medicine | Admitting: Emergency Medicine

## 2020-12-27 ENCOUNTER — Other Ambulatory Visit: Payer: Self-pay | Admitting: Obstetrics & Gynecology

## 2020-12-27 ENCOUNTER — Other Ambulatory Visit: Payer: Self-pay

## 2020-12-27 DIAGNOSIS — G8918 Other acute postprocedural pain: Secondary | ICD-10-CM | POA: Insufficient documentation

## 2020-12-27 DIAGNOSIS — N938 Other specified abnormal uterine and vaginal bleeding: Secondary | ICD-10-CM | POA: Insufficient documentation

## 2020-12-27 DIAGNOSIS — N939 Abnormal uterine and vaginal bleeding, unspecified: Secondary | ICD-10-CM

## 2020-12-27 DIAGNOSIS — Z8616 Personal history of COVID-19: Secondary | ICD-10-CM | POA: Insufficient documentation

## 2020-12-27 DIAGNOSIS — Z87891 Personal history of nicotine dependence: Secondary | ICD-10-CM | POA: Insufficient documentation

## 2020-12-27 LAB — TYPE AND SCREEN
ABO/RH(D): A POS
Antibody Screen: NEGATIVE

## 2020-12-27 LAB — CBC
HCT: 38.5 % (ref 36.0–46.0)
Hemoglobin: 11.7 g/dL — ABNORMAL LOW (ref 12.0–15.0)
MCH: 21.1 pg — ABNORMAL LOW (ref 26.0–34.0)
MCHC: 30.4 g/dL (ref 30.0–36.0)
MCV: 69.5 fL — ABNORMAL LOW (ref 80.0–100.0)
Platelets: 241 10*3/uL (ref 150–400)
RBC: 5.54 MIL/uL — ABNORMAL HIGH (ref 3.87–5.11)
RDW: 25 % — ABNORMAL HIGH (ref 11.5–15.5)
WBC: 11.5 10*3/uL — ABNORMAL HIGH (ref 4.0–10.5)
nRBC: 0 % (ref 0.0–0.2)

## 2020-12-27 LAB — BASIC METABOLIC PANEL
Anion gap: 11 (ref 5–15)
BUN: 9 mg/dL (ref 6–20)
CO2: 16 mmol/L — ABNORMAL LOW (ref 22–32)
Calcium: 8.9 mg/dL (ref 8.9–10.3)
Chloride: 106 mmol/L (ref 98–111)
Creatinine, Ser: 0.83 mg/dL (ref 0.44–1.00)
GFR, Estimated: >60 mL/min (ref >60)
Glucose, Bld: 264 mg/dL — ABNORMAL HIGH (ref 70–99)
Potassium: 3.8 mmol/L (ref 3.5–5.1)
Sodium: 133 mmol/L — ABNORMAL LOW (ref 135–145)

## 2020-12-27 MED ORDER — NORETHINDRONE 0.35 MG PO TABS: 1.0000 | ORAL_TABLET | Freq: Every day | ORAL | 1 refills | Status: DC

## 2020-12-27 MED ORDER — OXYCODONE-ACETAMINOPHEN 5-325 MG PO TABS
1.0000 | ORAL_TABLET | ORAL | Status: AC | PRN
  Administered 2020-12-27 – 2020-12-28 (×2): 1 via ORAL
  Filled 2020-12-27 (×2): qty 1

## 2020-12-27 NOTE — ED Triage Notes (Signed)
Pt presents to ED POV. Pt c/o abd pain and vaginal bleeding pt. Pt reports that she had d&c and hysterectomy on 2/1. Pt reports that she was having light bleeding but bleeding has become much worse. Pt reports large clots aprox size of her hand and 1 pad per hour

## 2020-12-28 ENCOUNTER — Emergency Department (HOSPITAL_COMMUNITY): Payer: Self-pay

## 2020-12-28 ENCOUNTER — Encounter (HOSPITAL_COMMUNITY): Payer: Self-pay | Admitting: Obstetrics and Gynecology

## 2020-12-28 LAB — CBC
HCT: 35 % — ABNORMAL LOW (ref 36.0–46.0)
Hemoglobin: 10.1 g/dL — ABNORMAL LOW (ref 12.0–15.0)
MCH: 20.7 pg — ABNORMAL LOW (ref 26.0–34.0)
MCHC: 28.9 g/dL — ABNORMAL LOW (ref 30.0–36.0)
MCV: 71.7 fL — ABNORMAL LOW (ref 80.0–100.0)
Platelets: 201 10*3/uL (ref 150–400)
RBC: 4.88 MIL/uL (ref 3.87–5.11)
RDW: 24.5 % — ABNORMAL HIGH (ref 11.5–15.5)
WBC: 10.6 10*3/uL — ABNORMAL HIGH (ref 4.0–10.5)
nRBC: 0 % (ref 0.0–0.2)

## 2020-12-28 MED ORDER — METRONIDAZOLE 500 MG PO TABS
500.0000 mg | ORAL_TABLET | Freq: Once | ORAL | Status: AC
  Administered 2020-12-28: 500 mg via ORAL
  Filled 2020-12-28: qty 1

## 2020-12-28 MED ORDER — SODIUM CHLORIDE 0.9 % IV BOLUS
1000.0000 mL | Freq: Once | INTRAVENOUS | Status: AC
  Administered 2020-12-28: 1000 mL via INTRAVENOUS

## 2020-12-28 MED ORDER — ONDANSETRON HCL 4 MG/2ML IJ SOLN
4.0000 mg | Freq: Once | INTRAMUSCULAR | Status: AC
  Administered 2020-12-28: 4 mg via INTRAVENOUS
  Filled 2020-12-28: qty 2

## 2020-12-28 MED ORDER — CIPROFLOXACIN HCL 500 MG PO TABS: 500.0000 mg | ORAL_TABLET | Freq: Two times a day (BID) | ORAL | 0 refills | Status: DC

## 2020-12-28 MED ORDER — METRONIDAZOLE 500 MG PO TABS: 500.0000 mg | ORAL_TABLET | Freq: Two times a day (BID) | ORAL | 0 refills | Status: AC

## 2020-12-28 MED ORDER — OXYCODONE-ACETAMINOPHEN 5-325 MG PO TABS
1.0000 | ORAL_TABLET | Freq: Once | ORAL | Status: AC
  Administered 2020-12-28: 1 via ORAL
  Filled 2020-12-28: qty 1

## 2020-12-28 MED ORDER — MEDROXYPROGESTERONE ACETATE 10 MG PO TABS
10.0000 mg | ORAL_TABLET | Freq: Once | ORAL | Status: AC
  Administered 2020-12-28: 10 mg via ORAL
  Filled 2020-12-28: qty 1

## 2020-12-28 MED ORDER — MORPHINE SULFATE (PF) 4 MG/ML IV SOLN
4.0000 mg | Freq: Once | INTRAVENOUS | Status: AC
  Administered 2020-12-28: 4 mg via INTRAVENOUS
  Filled 2020-12-28: qty 1

## 2020-12-28 MED ORDER — OXYCODONE-ACETAMINOPHEN 5-325 MG PO TABS: 1.0000 | ORAL_TABLET | ORAL | 0 refills | Status: DC | PRN

## 2020-12-28 MED ORDER — MEDROXYPROGESTERONE ACETATE 10 MG PO TABS: 10.0000 mg | ORAL_TABLET | Freq: Three times a day (TID) | ORAL | 0 refills | Status: DC

## 2020-12-28 MED ORDER — CIPROFLOXACIN HCL 500 MG PO TABS
500.0000 mg | ORAL_TABLET | Freq: Once | ORAL | Status: AC
  Administered 2020-12-28: 500 mg via ORAL
  Filled 2020-12-28: qty 1

## 2020-12-28 NOTE — ED Notes (Signed)
Pt remains in general waiting area.  Updated on wait and pain medication given.

## 2020-12-28 NOTE — ED Notes (Signed)
Pt is still in US

## 2020-12-28 NOTE — ED Notes (Signed)
Pt transported to [facility]

## 2020-12-28 NOTE — Discharge Instructions (Signed)
Your laboratory testing today showed you did have a mild anemia with all of the vaginal bleeding however it did not drop to point where he needed blood transfusion.  Your pelvic exam was consistent with uterine bleeding from your procedures.  I spoke with the OB/GYN team and they recommended the ultrasound which did not show any other concerning findings.  They recommended treating you for possible early infection causing the pain as well as with medication help stop the bleeding.  I will also include prescription for pain medicine help with your discomfort.  Please rest and stay hydrated and they will see you this week.  If they do not call you to schedule movement of the neck several days, please call to confirm an appointment for reassessment.  If at any point, symptoms change or worsen or you start having worsened symptoms of chest pain, shortness breath, lightheadedness, or feeling going to pass out, please return to the nearest emergency department.

## 2020-12-28 NOTE — ED Notes (Signed)
Pt transported to ultrasound.

## 2020-12-28 NOTE — ED Provider Notes (Signed)
[facility] EMERGENCY DEPARTMENT Provider Note   CSN: [id number] Arrival date & time: 12/27/20  1720     History Chief Complaint  Patient presents with  . Post-op Problem    Lori Tran is a 30 y.o. female.  The history is provided by the patient and medical records. No language interpreter was used.  Vaginal Bleeding Quality:  Bright red, clots, heavier than menses and dark red Severity:  Severe Onset quality:  Sudden Duration:  1 day Timing:  Constant Progression:  Waxing and waning Chronicity:  New        Past Medical History:  Diagnosis Date  . Anemia   . COVID 11/2018   sob, diarrhea fever loss of taste and smell weakness x 1 month all symptoms resolved  . Headache   . PCOS (polycystic ovarian syndrome)   . Thickened endometrium   . Wears glasses     Patient Active Problem List   Diagnosis Date Noted  . Irregular bleeding 11/14/2020  . PCOS (polycystic ovarian syndrome) 11/14/2020    Past Surgical History:  Procedure Laterality Date  . HYSTEROSCOPY WITH D & C N/A 12/24/2020   Procedure: DILATATION AND CURETTAGE /HYSTEROSCOPY;  Surgeon: Jerene Bears, MD;  Location: [facility];  Service: Gynecology;  Laterality: N/A;  . NO PAST SURGERIES       OB History    Gravida  0   Para  0   Term  0   Preterm  0   AB  0   Living  0     SAB  0   IAB  0   Ectopic  0   Multiple  0   Live Births  0           Family History  Problem Relation Age of Onset  . Diabetes Mother     Social History   Tobacco Use  . Smoking status: Former Games developer  . Smokeless tobacco: Never Used  . Tobacco comment: occ x 3 years  Vaping Use  . Vaping Use: Former  Substance Use Topics  . Alcohol use: Not Currently  . Drug use: Never    Home Medications Prior to Admission medications   Medication Sig Start Date End Date Taking? Authorizing Provider  ferrous sulfate 325 (65 FE) MG tablet Take 1 tab every other  day with OJ. 11/14/20   Jerene Bears, MD  HYDROcodone-acetaminophen (NORCO/VICODIN) 5-325 MG tablet Take 1 tablet by mouth every 6 (six) hours as needed for moderate pain. 12/24/20   Jerene Bears, MD  ibuprofen (ADVIL) 800 MG tablet Take 1 tablet (800 mg total) by mouth every 8 (eight) hours as needed. 12/24/20   Jerene Bears, MD  norethindrone (MICRONOR) 0.35 MG tablet Take 1 tablet (0.35 mg total) by mouth daily. 12/27/20   Jerene Bears, MD    Allergies    Patient has no known allergies.  Review of Systems   Review of Systems  Genitourinary: Positive for vaginal bleeding.    Physical Exam Updated Vital Signs BP 123/85 (BP Location: Left Arm)   Pulse 85   Temp 98.2 F (36.8 C) (Oral)   Resp 16   SpO2 95%   Physical Exam Vitals and nursing note reviewed.  Constitutional:      General: She is not in acute distress.    Appearance: She is well-developed and well-nourished. She is not diaphoretic.  HENT:     Head: Normocephalic and atraumatic.  Right Ear: External ear normal.     Left Ear: External ear normal.     Nose: Nose normal.     Mouth/Throat:     Mouth: Oropharynx is clear and moist.     Pharynx: No oropharyngeal exudate.  Eyes:     Extraocular Movements: EOM normal.     Conjunctiva/sclera: Conjunctivae normal.     Pupils: Pupils are equal, round, and reactive to light.  Pulmonary:     Effort: No respiratory distress.     Breath sounds: No stridor.  Abdominal:     General: There is no distension.     Tenderness: There is no abdominal tenderness. There is no rebound.  Genitourinary:    Vagina: Tenderness present. No vaginal discharge.     Cervix: Cervical bleeding present. No discharge.     Comments: Patient had dark blood coming from the cervical os and tenderness with speculum placement.  I do not see any external lacerations to the cervical os on my initial inspection. Musculoskeletal:     Cervical back: Normal range of motion and neck supple.  Skin:     General: Skin is warm.     Findings: No erythema or rash.  Neurological:     Mental Status: She is alert and oriented to person, place, and time.     Motor: No abnormal muscle tone.     Coordination: Coordination normal.     Deep Tendon Reflexes: Reflexes are normal and symmetric.     ED Results / Procedures / Treatments   Labs (all labs ordered are listed, but only abnormal results are displayed) Labs Reviewed  CBC - Abnormal; Notable for the following components:      Result Value   WBC 11.5 (*)    RBC 5.54 (*)    Hemoglobin 11.7 (*)    MCV 69.5 (*)    MCH 21.1 (*)    RDW 25.0 (*)    All other components within normal limits  BASIC METABOLIC PANEL - Abnormal; Notable for the following components:   Sodium 133 (*)    CO2 16 (*)    Glucose, Bld 264 (*)    All other components within normal limits  CBC - Abnormal; Notable for the following components:   WBC 10.6 (*)    Hemoglobin 10.1 (*)    HCT 35.0 (*)    MCV 71.7 (*)    MCH 20.7 (*)    MCHC 28.9 (*)    RDW 24.5 (*)    All other components within normal limits  TYPE AND SCREEN    EKG None  Radiology US Transvaginal Non-OB  Result Date: 12/28/2020 CLINICAL DATA:  Hysteroscopy with D and C. Postoperative uterine bleeding. EXAM: TRANSABDOMINAL AND TRANSVAGINAL ULTRASOUND OF PELVIS TECHNIQUE: Both transabdominal and transvaginal ultrasound examinations of the pelvis were performed. Transabdominal technique was performed for global imaging of the pelvis including uterus, ovaries, adnexal regions, and pelvic cul-de-sac. It was necessary to proceed with endovaginal exam following the transabdominal exam to visualize the uterus, bilateral ovaries and endometrium. COMPARISON:  October 24, 2020 FINDINGS: Uterus Measurements: 10.3 x 5.0 x 6.0 cm = volume: 162 mL. No fibroids or other mass visualized. Endometrium Thickness: 13 mm. The endometrium is mildly heterogeneous in appearance with a trace amount of fluid. No definitive  internal vascularity noted. Right ovary Only visualized transabdominally. Measurements: 3.2 x 2.2 x 2.3 cm = volume: 8 mL. Normal appearance/no adnexal mass. Left ovary Measurements: 2.5 x 1.7 x 1.9 cm = volume: 4  mL. Normal appearance/no adnexal mass. Other findings No abnormal free fluid. IMPRESSION: No definitive blood flow noted within the endometrium to suggest fistula or pseudoaneurysm formation. Endometrium measures approximately 13 mm and is mildly heterogeneous in appearance, likely due to recent surgical intervention. Electronically Signed   By: Meda KlinefelterStephanie  Peacock MD   On: 12/28/2020 19:33   US Pelvis Complete  Result Date: 12/28/2020 CLINICAL DATA:  Hysteroscopy with D and C. Postoperative uterine bleeding. EXAM: TRANSABDOMINAL AND TRANSVAGINAL ULTRASOUND OF PELVIS TECHNIQUE: Both transabdominal and transvaginal ultrasound examinations of the pelvis were performed. Transabdominal technique was performed for global imaging of the pelvis including uterus, ovaries, adnexal regions, and pelvic cul-de-sac. It was necessary to proceed with endovaginal exam following the transabdominal exam to visualize the uterus, bilateral ovaries and endometrium. COMPARISON:  October 24, 2020 FINDINGS: Uterus Measurements: 10.3 x 5.0 x 6.0 cm = volume: 162 mL. No fibroids or other mass visualized. Endometrium Thickness: 13 mm. The endometrium is mildly heterogeneous in appearance with a trace amount of fluid. No definitive internal vascularity noted. Right ovary Only visualized transabdominally. Measurements: 3.2 x 2.2 x 2.3 cm = volume: 8 mL. Normal appearance/no adnexal mass. Left ovary Measurements: 2.5 x 1.7 x 1.9 cm = volume: 4 mL. Normal appearance/no adnexal mass. Other findings No abnormal free fluid. IMPRESSION: No definitive blood flow noted within the endometrium to suggest fistula or pseudoaneurysm formation. Endometrium measures approximately 13 mm and is mildly heterogeneous in appearance, likely due to  recent surgical intervention. Electronically Signed   By: Meda KlinefelterStephanie  Peacock MD   On: 12/28/2020 19:33    Procedures Procedures   Medications Ordered in ED Medications  medroxyPROGESTERone (PROVERA) tablet 10 mg (has no administration in time range)  oxyCODONE-acetaminophen (PERCOCET/ROXICET) 5-325 MG per tablet 1 tablet (has no administration in time range)  ciprofloxacin (CIPRO) tablet 500 mg (has no administration in time range)  metroNIDAZOLE (FLAGYL) tablet 500 mg (has no administration in time range)  oxyCODONE-acetaminophen (PERCOCET/ROXICET) 5-325 MG per tablet 1 tablet (1 tablet Oral Given 12/28/20 1408)  sodium chloride 0.9 % bolus 1,000 mL (0 mLs Intravenous Stopped 12/28/20 1804)  morphine 4 MG/ML injection 4 mg (4 mg Intravenous Given 12/28/20 1659)  ondansetron (ZOFRAN) injection 4 mg (4 mg Intravenous Given 12/28/20 1659)    ED Course  I have reviewed the triage vital signs and the nursing notes.  Pertinent labs & imaging results that were available during my care of the patient were reviewed by me and considered in my medical decision making (see chart for details).    MDM Rules/Calculators/A&P                          Lori Tran is a 30 y.o. female with a past medical history significant for PCOS, irregular menstrual bleeding, and recent hysteroscopy with D&C of some polyps on 12/24/2020 (4 days ago), who presents with severe abdominal pain and vaginal bleeding.  Patient reports that she had her procedure done with Dr. Valentina ShaggyMary Miller on Tuesday and after discharge was doing well.  She says that yesterday afternoon, she started having significant 9 out of 10 abdominal pain in the lower abdomen and then started having bleeding from her vagina.  She reports that she started passing big clots and feeling like things were tearing in her uterus.  She reports the pain has been waxing and waning but she has been bleeding nearly nonstop for the last 24 hours.  Of note, patient waited  approximately 22-1/2 hours in the waiting room prior to my initial evaluation.  Upon arrival to the exam room, I assessed the patient and her blood pressure was normal at 116 systolic.  Patient does report having some systemic symptoms of lightheadedness and fatigue throughout the day and is also had some nausea when the pain is severe.  She reports the pain is now a 9 out of 10.  She reports she has been soaking through pads nearly hourly for the last day.  She tried to call her OB/GYN team but could not get connected before coming to the emergency department.  On exam, lungs are clear and chest is nontender.  Abdomen is tender primarily in the inferior abdomen.  Normal bowel sounds.   she is not hypotensive or tachycardic on monitor evaluation.  Patient had some screening labs upon arrival including a type and screen and some basic blood work.  Her initial hemoglobin collected at around 6 PM yesterday showed hemoglobin 11.7.  We will trend it.  She did have a mild leukocytosis of 11.5.  Her metabolic panel\did not show any evidence of kidney abnormality.  Her pregnancy test was negative 4 days ago.  I spoke to the OB/GYN team who recommended doing a pelvic exam to look for an external source of the bleeding in the vagina but if the bleeding is coming from the cervical os, to get a pelvic ultrasound.  They agreed with trending hemoglobin and giving her some fluids and pain medication and nausea medicine.  They instructed [place] to call them back after the pelvic exam and labs have been completed.  Anticipate reassessment  Pelvic exam did show bleeding, from the os.  She had severe tenderness with speculum placement and did not tolerate bimanual exam initially.  As the primary question was where the source of bleeding was coming from, did not push the bimanual exam and will get the pelvic ultrasound.  ultrasound was ordered.  I called the lab when my CBC to be repeated was discontinued.  They reported that  the blood had clotted and needed to be recollected.  A repeat CBC was ordered.   Repeat CBC showed downtrending of hemoglobin but it only dropped to 10.1.  The ultrasound also was completed and showed some endometrial thickening but no evidence of aneurysm or abscess or other concerning findings.  I called OB/GYN team who reviewed the case again and feel she is safe for discharge home.  They recommended we start Provera to help with the bleeding and then start Cipro and Flagyl just in case there is a postoperative infection developing.  They agree they did not feel she needs admission at this time given her reassuring vital signs.  I spoke with the patient and she agrees with this plan.  Patient will be given a work note and will see her OB/GYN in the next few days.  She understands extremely strict return precautions for worsening symptoms including any symptoms of worsened anemia.  She had no other questions or concerns and was discharged in good condition after tolerating p.o.  Final Clinical Impression(s) / ED Diagnoses Final diagnoses:  Post-operative pain  Vaginal bleeding    Rx / DC Orders ED Discharge Orders    None     Clinical Impression: 1. Post-operative pain   2. Vaginal bleeding     Disposition: Discharge  Condition: Good  I have discussed the results, Dx and Tx plan with the pt(& family if present). He/she/they expressed understanding and  agree(s) with the plan. Discharge instructions discussed at great length. Strict return precautions discussed and pt &/or family have verbalized understanding of the instructions. No further questions at time of discharge.    New Prescriptions   CIPROFLOXACIN (CIPRO) 500 MG TABLET    Take 1 tablet (500 mg total) by mouth 2 (two) times daily.   MEDROXYPROGESTERONE (PROVERA) 10 MG TABLET    Take 1 tablet (10 mg total) by mouth every 8 (eight) hours for 5 days.   METRONIDAZOLE (FLAGYL) 500 MG TABLET    Take 1 tablet (500 mg total) by  mouth 2 (two) times daily for 5 days.   OXYCODONE-ACETAMINOPHEN (PERCOCET/ROXICET) 5-325 MG TABLET    Take 1 tablet by mouth every 4 (four) hours as needed for severe pain.    Follow Up: Your OBGYN this week     [facility] EMERGENCY DEPARTMENT [address] [id number] mc [place] [address] [phone]       Tegeler, Canary Brim, MD 12/28/20 2329

## 2020-12-30 ENCOUNTER — Telehealth: Payer: Self-pay

## 2020-12-30 NOTE — Telephone Encounter (Signed)
Called pt in regards to MyChart message sent.  Left message to return call back.   Per Dr. Hyacinth Meeker, pt was prescribed Micronor for bleeding.  Per provider if patient is taking the Micronor along with the Provera that was prescribed by the ER pt can have refills on both (Micronor and Provera po daily instead of for 5 days).  If patient is not taking the Micronor and is taking the Provera that was prescribed by the ED, then patient can have refill on Provera for daily use until patient is seen by Dr. Hyacinth Meeker.    Leonette Nutting  12/30/20

## 2020-12-31 NOTE — Telephone Encounter (Addendum)
Attempted to call patient x 2 and left message that I am calling to f/u if she could please give the office a call back.  MyChart message sent.   Leonette Nutting  12/31/20

## 2021-01-01 ENCOUNTER — Telehealth: Payer: Self-pay | Admitting: Family Medicine

## 2021-01-01 NOTE — Telephone Encounter (Signed)
I have called pt several times to advise of appt tomorrow-- 01-02-21 at 3:25pm for f/u appt. I left a voicemail and sent mychart message

## 2021-01-02 ENCOUNTER — Ambulatory Visit (INDEPENDENT_AMBULATORY_CARE_PROVIDER_SITE_OTHER): Payer: Self-pay | Admitting: Obstetrics and Gynecology

## 2021-01-02 ENCOUNTER — Other Ambulatory Visit: Payer: Self-pay

## 2021-01-02 ENCOUNTER — Encounter: Payer: Self-pay | Admitting: Obstetrics and Gynecology

## 2021-01-02 VITALS — BP 126/74 | HR 90 | Ht 61.0 in | Wt 190.4 lb

## 2021-01-02 DIAGNOSIS — N3000 Acute cystitis without hematuria: Secondary | ICD-10-CM

## 2021-01-02 DIAGNOSIS — N390 Urinary tract infection, site not specified: Secondary | ICD-10-CM | POA: Insufficient documentation

## 2021-01-02 DIAGNOSIS — N926 Irregular menstruation, unspecified: Secondary | ICD-10-CM

## 2021-01-02 DIAGNOSIS — Z906 Acquired absence of other parts of urinary tract: Secondary | ICD-10-CM

## 2021-01-02 MED ORDER — PHENAZOPYRIDINE HCL 200 MG PO TABS: 200.0000 mg | ORAL_TABLET | Freq: Three times a day (TID) | ORAL | 1 refills | Status: DC | PRN

## 2021-01-02 NOTE — Progress Notes (Signed)
Lori Tran presents as work in for pelvic pain and vaginal bleeding following D & C on 12/24/20 by Dr. Hyacinth Meeker. Surgery performed d/t thicken endometrium, uterine polyp in setting of DUB and PCOS.  Pt reports was doing well until 12/27/20, when she developed acute lower abd/pelvic pain with heavy bleeding. Seen in ER without evidence of specific cause of Sx. Placed on Provera 10 mg tid, Cipro and Flagyl.  Pt reports bleeding is a little better during the day, but heavy at night time. Still with cramps. Some problems with constipation as well. Denies sexual activity or bladder dysfunction.  PE AF VSS WDWM female in NAD  Lungs clear Heart RRR Abd Soft + BS GU Nl EGBUS scant blood noted form the cervical os, no cervical or vaginal lacerations noted, + bladder tenderness, no uterine or adnexal masses or tenderness  A/P Vaginal bleeding        Bladder tenderness  Pt reassured in regards to bleeding. As per Dr Rondel Baton note, will d/c Provera and start MicroNor. Pt instructed to pick up Rx a pharmacy. [name] pain.cramps are related to her bladder. Complete Cipro and Flagyl. Will add Pyridium. Motrin as needed for pain. F/U with Dr. Paul Half in 2-3 weeks.

## 2021-03-26 ENCOUNTER — Encounter: Payer: Self-pay | Admitting: Obstetrics and Gynecology

## 2021-03-26 ENCOUNTER — Ambulatory Visit (INDEPENDENT_AMBULATORY_CARE_PROVIDER_SITE_OTHER): Payer: Self-pay | Admitting: Obstetrics and Gynecology

## 2021-03-26 ENCOUNTER — Other Ambulatory Visit: Payer: Self-pay

## 2021-03-26 VITALS — BP 130/85 | HR 74 | Ht 61.0 in | Wt 192.0 lb

## 2021-03-26 DIAGNOSIS — Z3202 Encounter for pregnancy test, result negative: Secondary | ICD-10-CM

## 2021-03-26 DIAGNOSIS — R300 Dysuria: Secondary | ICD-10-CM

## 2021-03-26 DIAGNOSIS — Z319 Encounter for procreative management, unspecified: Secondary | ICD-10-CM

## 2021-03-26 DIAGNOSIS — E282 Polycystic ovarian syndrome: Secondary | ICD-10-CM

## 2021-03-26 DIAGNOSIS — N926 Irregular menstruation, unspecified: Secondary | ICD-10-CM

## 2021-03-26 LAB — POCT URINALYSIS DIP (DEVICE)
Bilirubin Urine: NEGATIVE
Glucose, UA: NEGATIVE mg/dL
Ketones, ur: NEGATIVE mg/dL
Nitrite: NEGATIVE
Protein, ur: 30 mg/dL — AB
Specific Gravity, Urine: >=1.03 (ref 1.005–1.030)
Urobilinogen, UA: 0.2 mg/dL (ref 0.0–1.0)
pH: 6 (ref 5.0–8.0)

## 2021-03-26 MED ORDER — PROGESTERONE 200 MG PO CAPS: ORAL_CAPSULE | ORAL | 2 refills | Status: AC

## 2021-03-26 NOTE — Patient Instructions (Signed)
Clomid challenge/therapy Given that patient has anovulatory cycles, discussed Clomid challenge/therapy.  She was given Rx for Prometrium 10mg  po qd x 10 days.   She was told to watch for withdrawal bleeding after the course, the first day of bleeding will be Day 1 for her next cycle.  She was also given a Rx for Clomid 50mg  po qd to take on days 5-9 of her cycle.  The risks of Clomid including ovarian hyperstimulation with possible risk of ovarian cancer as well as multiple gestation were discussed with patient.  Patient also advised to continue frequent intercourse especially around Day 14 of her upcoming cycle (qod intercourse around days 9 - 18).  By Day 35, patient was told to call if she had her period.  If patient has bleeding at end of cycle, will increase Clomid by 50mg  for the following cycle; she can have a total of 6 cycles.  However if patient does not have bleeding, she was told to do a pregnancy test/come in for evaluation.   CLOMID PATIENT INSTRUCTIONS  WHY USE IT? Clomid helps your ovaries to release eggs (ovulate).  HOW TO USE IT? Clomid is taken as a pill usually on days 5,6,7,8, & 9 of your cycle.  Day 1 is the first day of your period. The dose or duration may be changed to achieve ovulation.  Provera (progesterone) may first be used to bring on a period for some patients.  If you do not get pregnant this cycle, for your next cycles, take on days 1, 2, 3, 4 and 5.  If you do not get a period, take Provera 10 mg daily for 10 days to bring on a period; the first day you get bleeding is Day 1 of your cycle. The day of ovulation on Clomid is usually between cycle day 14 and 17.  Having sexual intercourse at least every other day between cycle day 13 and 18 will improve your chances of becoming pregnant during the Clomid cycle.  You may monitor your ovulation using basal body temperature charts or with ovulation kits.  If using the ovulation predictor kits, having intercourse the day of  the surge and the two days following is recommended. If you get your period, call when it starts for an appointment with your doctor, so that an exam may be done, and another Clomid cycle can be considered if appropriate. If you do not get a period by day 35 of the cycle, please get a blood pregnancy test.  If it is negative, speak to your doctor for instructions to bring on another period and to plan a follow-up appointment.  THINGS TO KNOW: If you get pregnant while using Clomid, your chance of twins is 7% and triplets is less than 1%. Some studies have suggested the use of "fertility drugs" may increase your risk of ovarian cancers in the future.  It is unclear if these drugs increase the risk, or people who have problems with fertility are prone for these cancers.  If there is an actual risk, it is very low.  If you have a history of liver problems or ovarian cancer, it may be wise to avoid this medication.  SIDE EFFECTS:  The most common side effect is hot flashes (20%).  Breast tenderness, headaches, nausea, bloating may also occur at different times.  Less than 3/1,000 people have dryness or loss of hair.  Persistent ovarian cysts may form from the use of this medication.  Ovarian hyperstimulation syndrome is a  rare side effect at low doses.  Visual changes like flashes of light or blurring.

## 2021-03-26 NOTE — Progress Notes (Signed)
Pain & spotting contin. For about a month. Has taken UPT 1+the rest negative.

## 2021-03-26 NOTE — Progress Notes (Signed)
Patient ID: Lori Tran, female   DOB: [birth year], 30 y.o.   MRN: [id number] Lori Tran presents today to discuss irregular cycles, H/O PCOS and desire for pregnancy. Pt underwent Hysteroscopy D & C in Feb for endometrial polyp. Reports regular cycle until last month. She has had some degree of spotting each day. She is sexual active without contraception. Was previously on MicroNor but stopped due to feeling depressed and wt gain.  Known H/O PCOS. Has seen [facility] in the past. Normal semen analysis. Tried Colmid for 2 months, ? Ovulated.   She has also noted what appears to be tissue in her urine.  PE AF VSS Lungs clear  Heart RRR Abd soft + BS   A/P Desire for pregnancy        Irregular cycle        H/O PCOS  Discussed options with pt. Due to desire for pregnancy. Will cycle with Prometrium, and check CD 21 progesterone. F/U in 3-4 months

## 2021-03-27 LAB — URINALYSIS, ROUTINE W REFLEX MICROSCOPIC
Bilirubin, UA: NEGATIVE
Glucose, UA: NEGATIVE
Ketones, UA: NEGATIVE
Nitrite, UA: NEGATIVE
Specific Gravity, UA: >=1.03 — AB (ref 1.005–1.030)
Urobilinogen, Ur: 1 mg/dL (ref 0.2–1.0)
pH, UA: 6 (ref 5.0–7.5)

## 2021-03-27 LAB — MICROSCOPIC EXAMINATION
Casts: NONE SEEN /lpf
Epithelial Cells (non renal): >10 /hpf — AB (ref 0–10)

## 2021-03-28 LAB — URINE CULTURE: Organism ID, Bacteria: NO GROWTH

## 2021-04-23 ENCOUNTER — Other Ambulatory Visit: Payer: Self-pay

## 2021-04-23 ENCOUNTER — Ambulatory Visit (INDEPENDENT_AMBULATORY_CARE_PROVIDER_SITE_OTHER): Payer: Self-pay

## 2021-04-23 DIAGNOSIS — Z319 Encounter for procreative management, unspecified: Secondary | ICD-10-CM

## 2021-04-23 NOTE — Progress Notes (Signed)
Agree with A & P. 

## 2021-04-23 NOTE — Progress Notes (Signed)
Here today for progesterone lab draw on day 21 of menstrual cycle per Alysia Penna, MD. LMP is 04/02/21. Pt has no other concerns at this time.   Fleet Contras RN  04/23/21

## 2021-04-24 LAB — PROGESTERONE: Progesterone: 0.1 ng/mL

## 2022-02-12 ENCOUNTER — Ambulatory Visit
Admission: EM | Admit: 2022-02-12 | Discharge: 2022-02-12 | Disposition: A | Discharge status: Home / Self Care | Payer: Self-pay

## 2022-02-12 ENCOUNTER — Encounter (HOSPITAL_BASED_OUTPATIENT_CLINIC_OR_DEPARTMENT_OTHER): Payer: Self-pay | Admitting: Obstetrics and Gynecology

## 2022-02-12 ENCOUNTER — Other Ambulatory Visit: Payer: Self-pay

## 2022-02-12 ENCOUNTER — Emergency Department (HOSPITAL_BASED_OUTPATIENT_CLINIC_OR_DEPARTMENT_OTHER): Payer: Self-pay

## 2022-02-12 ENCOUNTER — Emergency Department (HOSPITAL_BASED_OUTPATIENT_CLINIC_OR_DEPARTMENT_OTHER)
Admission: EM | Admit: 2022-02-12 | Discharge: 2022-02-12 | Disposition: A | Discharge status: Home / Self Care | Payer: Self-pay | Attending: Emergency Medicine | Admitting: Emergency Medicine

## 2022-02-12 DIAGNOSIS — R1030 Lower abdominal pain, unspecified: Secondary | ICD-10-CM

## 2022-02-12 DIAGNOSIS — R112 Nausea with vomiting, unspecified: Secondary | ICD-10-CM | POA: Insufficient documentation

## 2022-02-12 DIAGNOSIS — R1031 Right lower quadrant pain: Secondary | ICD-10-CM | POA: Insufficient documentation

## 2022-02-12 DIAGNOSIS — R1011 Right upper quadrant pain: Secondary | ICD-10-CM | POA: Insufficient documentation

## 2022-02-12 DIAGNOSIS — R197 Diarrhea, unspecified: Secondary | ICD-10-CM | POA: Insufficient documentation

## 2022-02-12 LAB — URINALYSIS, ROUTINE W REFLEX MICROSCOPIC
Bilirubin Urine: NEGATIVE
Glucose, UA: NEGATIVE mg/dL
Hgb urine dipstick: NEGATIVE
Ketones, ur: NEGATIVE mg/dL
Leukocytes,Ua: NEGATIVE
Nitrite: NEGATIVE
Protein, ur: NEGATIVE mg/dL
Specific Gravity, Urine: 1.021 (ref 1.005–1.030)
pH: 5.5 (ref 5.0–8.0)

## 2022-02-12 LAB — CBC
HCT: 37.9 % (ref 36.0–46.0)
Hemoglobin: 11.3 g/dL — ABNORMAL LOW (ref 12.0–15.0)
MCH: 20.4 pg — ABNORMAL LOW (ref 26.0–34.0)
MCHC: 29.8 g/dL — ABNORMAL LOW (ref 30.0–36.0)
MCV: 68.4 fL — ABNORMAL LOW (ref 80.0–100.0)
Platelets: 269 10*3/uL (ref 150–400)
RBC: 5.54 MIL/uL — ABNORMAL HIGH (ref 3.87–5.11)
RDW: 18.6 % — ABNORMAL HIGH (ref 11.5–15.5)
WBC: 8.5 10*3/uL (ref 4.0–10.5)
nRBC: 0 % (ref 0.0–0.2)

## 2022-02-12 LAB — COMPREHENSIVE METABOLIC PANEL
ALT: 23 U/L (ref 0–44)
AST: 17 U/L (ref 15–41)
Albumin: 4.3 g/dL (ref 3.5–5.0)
Alkaline Phosphatase: 113 U/L (ref 38–126)
Anion gap: 8 (ref 5–15)
BUN: 11 mg/dL (ref 6–20)
CO2: 23 mmol/L (ref 22–32)
Calcium: 9.5 mg/dL (ref 8.9–10.3)
Chloride: 106 mmol/L (ref 98–111)
Creatinine, Ser: 0.71 mg/dL (ref 0.44–1.00)
GFR, Estimated: >60 mL/min (ref >60)
Glucose, Bld: 110 mg/dL — ABNORMAL HIGH (ref 70–99)
Potassium: 4.1 mmol/L (ref 3.5–5.1)
Sodium: 137 mmol/L (ref 135–145)
Total Bilirubin: 0.3 mg/dL (ref 0.3–1.2)
Total Protein: 8.2 g/dL — ABNORMAL HIGH (ref 6.5–8.1)

## 2022-02-12 LAB — PREGNANCY, URINE: Preg Test, Ur: NEGATIVE

## 2022-02-12 LAB — LIPASE, BLOOD: Lipase: 39 U/L (ref 11–51)

## 2022-02-12 MED ORDER — ONDANSETRON HCL 4 MG/2ML IJ SOLN
4.0000 mg | Freq: Once | INTRAMUSCULAR | Status: AC
  Administered 2022-02-12: 4 mg via INTRAVENOUS
  Filled 2022-02-12: qty 2

## 2022-02-12 MED ORDER — MORPHINE SULFATE (PF) 4 MG/ML IV SOLN
4.0000 mg | Freq: Once | INTRAVENOUS | Status: AC
  Administered 2022-02-12: 4 mg via INTRAVENOUS
  Filled 2022-02-12: qty 1

## 2022-02-12 MED ORDER — SUCRALFATE 1 G PO TABS: 1.0000 g | ORAL_TABLET | Freq: Three times a day (TID) | ORAL | 0 refills | Status: DC

## 2022-02-12 MED ORDER — ONDANSETRON 4 MG PO TBDP: 4.0000 mg | ORAL_TABLET | Freq: Three times a day (TID) | ORAL | 0 refills | Status: AC | PRN

## 2022-02-12 MED ORDER — IOHEXOL 300 MG/ML  SOLN
100.0000 mL | Freq: Once | INTRAMUSCULAR | Status: AC | PRN
  Administered 2022-02-12: 85 mL via INTRAVENOUS

## 2022-02-12 MED ORDER — SODIUM CHLORIDE 0.9 % IV BOLUS
1000.0000 mL | Freq: Once | INTRAVENOUS | Status: AC
  Administered 2022-02-12: 1000 mL via INTRAVENOUS

## 2022-02-12 MED ORDER — PANTOPRAZOLE SODIUM 20 MG PO TBEC: 20.0000 mg | DELAYED_RELEASE_TABLET | Freq: Every day | ORAL | 0 refills | Status: AC

## 2022-02-12 NOTE — Discharge Instructions (Addendum)
Please report to  ? ?[facility] ? ?[address] ?[place], [place] [address] ? ? ? ?

## 2022-02-12 NOTE — ED Triage Notes (Signed)
Pt presents with vomiting and diarrhea that started 3 days ago. She reports chills with fever.She is taking tyenol. ?

## 2022-02-12 NOTE — ED Provider Notes (Signed)
?[facility] EMERGENCY DEPT ?Provider Note ? ? ?CSN: [id number] ?Arrival date & time: 02/12/22  1330 ? ?  ? ?History ? ?Chief Complaint  ?Patient presents with  ? Emesis  ? ? ?Lori Tran is a 31 y.o. female with past medical history of anemia, PCOS.  Presents to the emergency department with a chief complaint of abdominal pain.  Pain has been intermittent over the last 3 days.  Pain gotten progressively worse at this time.  Pain is located to the entire right side of her abdomen.  Pain is worse with eating.  Last oral intake was noon today.  Patient reports that after eating pain became progressively worse.  Patient endorses nausea and vomiting.  Has vomited 3 times in the last 24 hours.  Describes emesis as stomach contents and bilious.  Endorses fever with Tmax of 101 ?F orally, chills and diarrhea.  No recent antibiotic use or international travel.  Denies frequent NSAID use.  Endorses occasional alcohol use.  Denies any illicit drug use.  Last menstrual period beginning of January. ? ?Denies any abdominal distention, blood in stool, melena, constipation, dysuria, hematuria, urinary urgency, urinary frequency, vaginal pain, vaginal bleeding, vaginal discharge, lightheadedness, syncope. ? ?Emesis ?Associated symptoms: diarrhea   ?Associated symptoms: no abdominal pain, no chills, no fever and no headaches   ? ?  ? ?Home Medications ?Prior to Admission medications   ?Medication Sig Start Date End Date Taking? Authorizing Provider  ?ferrous sulfate 325 (65 FE) MG tablet Take 1 tab every other day with OJ. 11/14/20   Megan Salon, MD  ?Prenatal Vit-Fe Fumarate-FA (MULTIVITAMIN-PRENATAL) 27-0.8 MG TABS tablet Take 1 tablet by mouth daily at 12 noon.    [provider]  ?progesterone (PROMETRIUM) 200 MG capsule Take a tablet first ten days of each month 03/26/21   Chancy Milroy, MD  ?   ? ?Allergies    ?Patient has no known allergies.   ? ?Review of Systems   ?Review of Systems   ?Constitutional:  Negative for chills and fever.  ?Respiratory:  Negative for shortness of breath.   ?Cardiovascular:  Negative for chest pain.  ?Gastrointestinal:  Positive for diarrhea, nausea and vomiting. Negative for abdominal distention, abdominal pain, anal bleeding, blood in stool, constipation and rectal pain.  ?Genitourinary:  Negative for decreased urine volume, difficulty urinating, dysuria, flank pain, frequency, genital sores, hematuria, pelvic pain, urgency, vaginal bleeding, vaginal discharge and vaginal pain.  ?Musculoskeletal:  Negative for back pain and neck pain.  ?Skin:  Negative for color change and rash.  ?Neurological:  Negative for dizziness, syncope, light-headedness and headaches.  ?Psychiatric/Behavioral:  Negative for confusion.   ? ?Physical Exam ?Updated Vital Signs ?BP 107/74 (BP Location: Right Arm)   Pulse (!) 53   Temp 98.5 ?F (36.9 ?C)   Resp 16   Ht 5\' 1"  (1.549 m)   Wt 83.9 kg   SpO2 100%   BMI 34.96 kg/m?  ?Physical Exam ?Vitals and nursing note reviewed.  ?Constitutional:   ?   General: She is not in acute distress. ?   Appearance: She is not ill-appearing, toxic-appearing or diaphoretic.  ?HENT:  ?   Head: Normocephalic.  ?Eyes:  ?   General: No scleral icterus.    ?   Right eye: No discharge.     ?   Left eye: No discharge.  ?Cardiovascular:  ?   Rate and Rhythm: Normal rate.  ?Pulmonary:  ?   Effort: Pulmonary effort is normal.  ?  Abdominal:  ?   General: Abdomen is flat. Bowel sounds are normal. There is no distension. There are no signs of injury.  ?   Palpations: Abdomen is soft. There is no mass or pulsatile mass.  ?   Tenderness: There is abdominal tenderness in the right upper quadrant and right lower quadrant. There is no right CVA tenderness, left CVA tenderness, guarding or rebound. Positive signs include Murphy's sign. Negative signs include McBurney's sign.  ?   Hernia: There is no hernia in the umbilical area or ventral area.  ?Skin: ?   General: Skin is  warm and dry.  ?Neurological:  ?   General: No focal deficit present.  ?   Mental Status: She is alert.  ?Psychiatric:     ?   Behavior: Behavior is cooperative.  ? ? ?ED Results / Procedures / Treatments   ?Labs ?(all labs ordered are listed, but only abnormal results are displayed) ?Labs Reviewed  ?COMPREHENSIVE METABOLIC PANEL - Abnormal; Notable for the following components:  ?    Result Value  ? Glucose, Bld 110 (*)   ? Total Protein 8.2 (*)   ? All other components within normal limits  ?CBC - Abnormal; Notable for the following components:  ? RBC 5.54 (*)   ? Hemoglobin 11.3 (*)   ? MCV 68.4 (*)   ? MCH 20.4 (*)   ? MCHC 29.8 (*)   ? RDW 18.6 (*)   ? All other components within normal limits  ?URINALYSIS, ROUTINE W REFLEX MICROSCOPIC - Abnormal; Notable for the following components:  ? APPearance HAZY (*)   ? All other components within normal limits  ?LIPASE, BLOOD  ?PREGNANCY, URINE  ? ? ?EKG ?None ? ?Radiology ?CT ABDOMEN PELVIS W CONTRAST ? ?Result Date: 02/12/2022 ?CLINICAL DATA:  Right lower quadrant abdominal pain. Epigastric pain. Three days duration. Nausea, vomiting and diarrhea. History of polycystic ovary disease. EXAM: CT ABDOMEN AND PELVIS WITH CONTRAST TECHNIQUE: Multidetector CT imaging of the abdomen and pelvis was performed using the standard protocol following bolus administration of intravenous contrast. RADIATION DOSE REDUCTION: This exam was performed according to the departmental dose-optimization program which includes automated exposure control, adjustment of the mA and/or kV according to patient size and/or use of iterative reconstruction technique. CONTRAST:  33mL OMNIPAQUE IOHEXOL 300 MG/ML  SOLN COMPARISON:  None. FINDINGS: Lower chest: Normal Hepatobiliary: Liver parenchyma is normal.  No calcified gallstones. Pancreas: Normal Spleen: Normal Adrenals/Urinary Tract: Adrenal glands are normal. Kidneys are normal. Bladder is normal. Stomach/Bowel: Stomach and small intestine are  normal. Normal appendix. Normal colon. Vascular/Lymphatic: Aorta and IVC are normal.  No adenopathy. Reproductive: Uterus is normal. Left ovary measures a proximally 3.4 x 1.8 x 2.7 cm and appears otherwise normal by CT. Right ovary measures 3 x 2.4 x 2.2 cm and appears otherwise normal by CT. Other: No free fluid or air. Musculoskeletal: Normal IMPRESSION: Normal CT scan of the abdomen and pelvis. No abnormality seen to explain right lower quadrant pain. Normal appearing appendix. Ovaries appear normal by CT, upper limits of normal in size. Electronically Signed   By: Nelson Chimes M.D.   On: 02/12/2022 15:56   ? ?Procedures ?Procedures  ? ? ?Medications Ordered in ED ?Medications  ?sodium chloride 0.9 % bolus 1,000 mL (0 mLs Intravenous Stopped 02/12/22 1836)  ?ondansetron [facility]) injection 4 mg (4 mg Intravenous Given 02/12/22 1511)  ?morphine (PF) 4 MG/ML injection 4 mg (4 mg Intravenous Given 02/12/22 1515)  ?iohexol (OMNIPAQUE) 300 MG/ML  solution 100 mL (85 mLs Intravenous Contrast Given 02/12/22 1534)  ? ? ?ED Course/ Medical Decision Making/ A&P ?  ?                        ?Medical Decision Making ?Amount and/or Complexity of Data Reviewed ?Labs: ordered. ?Radiology: ordered. ? ?Risk ?Prescription drug management. ? ? ?Alert 31 year old female in no acute distress, nontoxic-appearing.  Presents emergency department with a chief complaint of abdominal pain. ? ?Information is obtained from patient.  Past medical records were reviewed including previous provider notes, labs, and imaging.  Patient has past medical history as outlined in HPI which complicates her care. ? ?With tenderness to right upper and right lower quadrant differential includes but is not limited to biliary colic, cholecystitis, appendicitis, gastroenteritis. ? ?We will obtain CMP, CBC, lipase, urinalysis, pregnancy test, CT abdomen pelvis with contrast.  Patient given fluid bolus, morphine, and Zofran. ? ?I personally viewed and interpreted  patient's lab results.  Pertinent findings include: ?-No leukocytosis ?-Hemoglobin 11.3 ?-CMP unremarkable ?-Lipase within normal limits ?-UA shows no signs of infection ? ?I personally viewed and interpreted patient's CT

## 2022-02-12 NOTE — Discharge Instructions (Addendum)
You came to the emergency department today to be evaluated for your nausea, vomiting, and abdominal pain.  Your lab results were reassuring.  The CT scan of your abdomen pelvis showed no acute abnormalities.  Due to reports of nausea vomiting and given you prescription for Zofran.  You may take this medication every 8 hours as needed for nausea and vomiting. ? ?Get help right away if: ?Your pain does not go away as soon as your health care provider told you to expect. ?You cannot stop vomiting. ?Your pain is only in areas of the abdomen, such as the right side or the left lower portion of the abdomen. Pain on the right side could be caused by appendicitis. ?You have bloody or black stools, or stools that look like tar. ?You have severe pain, cramping, or bloating in your abdomen. ?You have signs of dehydration, such as: ?Dark urine, very little urine, or no urine. ?Cracked lips. ?Dry mouth. ?Sunken eyes. ?Sleepiness. ?Weakness. ?You have trouble breathing or chest pain. ?

## 2022-02-12 NOTE — ED Provider Notes (Signed)
?[facility] ? ? ? ?CSN: [id number] ?Arrival date & time: 02/12/22  1213 ? ? ?  ? ?History   ?Chief Complaint ?Chief Complaint  ?Patient presents with  ? Emesis  ? Diarrhea  ? Headache  ? ? ?HPI ?Lori Tran is a 31 y.o. female.  ? ?Patient here today for evaluation of nausea, vomiting, diarrhea, abdominal pain, headache that is worsening over the last 3 days. She reports abdominal pain is present diffusely to lower abdominal, periumbilical area. She does report some fever.  ? ?The history is provided by the patient.  ? ?Past Medical History:  ?Diagnosis Date  ? Anemia   ? COVID 11/2018  ? sob, diarrhea fever loss of taste and smell weakness x 1 month all symptoms resolved  ? Headache   ? PCOS (polycystic ovarian syndrome)   ? Thickened endometrium   ? Wears glasses   ? ? ?Patient Active Problem List  ? Diagnosis Date Noted  ? Dysuria 03/26/2021  ? Desire for pregnancy 03/26/2021  ? Irregular bleeding 11/14/2020  ? PCOS (polycystic ovarian syndrome) 11/14/2020  ? ? ?Past Surgical History:  ?Procedure Laterality Date  ? HYSTEROSCOPY WITH D & C N/A 12/24/2020  ? Procedure: DILATATION AND CURETTAGE /HYSTEROSCOPY;  Surgeon: Jerene Bears, MD;  Location: [facility];  Service: Gynecology;  Laterality: N/A;  ? ? ?OB History   ? ? Gravida  ?0  ? Para  ?0  ? Term  ?0  ? Preterm  ?0  ? AB  ?0  ? Living  ?0  ?  ? ? SAB  ?0  ? IAB  ?0  ? Ectopic  ?0  ? Multiple  ?0  ? Live Births  ?0  ?   ?  ?  ? ? ? ?Home Medications   ? ?Prior to Admission medications   ?Medication Sig Start Date End Date Taking? Authorizing Provider  ?ferrous sulfate 325 (65 FE) MG tablet Take 1 tab every other day with OJ. 11/14/20   Jerene Bears, MD  ?Prenatal Vit-Fe Fumarate-FA (MULTIVITAMIN-PRENATAL) 27-0.8 MG TABS tablet Take 1 tablet by mouth daily at 12 noon.    [provider]  ?progesterone (PROMETRIUM) 200 MG capsule Take a tablet first ten days of each month 03/26/21   Hermina Staggers, MD  ? ? ?Family  History ?Family History  ?Problem Relation Age of Onset  ? Diabetes Mother   ? ? ?Social History ?Social History  ? ?Tobacco Use  ? Smoking status: Former  ? Smokeless tobacco: Never  ? Tobacco comments:  ?  occ x 3 years  ?Vaping Use  ? Vaping Use: Former  ?Substance Use Topics  ? Alcohol use: Not Currently  ? Drug use: Never  ? ? ? ?Allergies   ?Patient has no known allergies. ? ? ?Review of Systems ?Review of Systems  ?Constitutional:  Positive for fever.  ?Eyes:  Negative for discharge and redness.  ?Gastrointestinal:  Positive for abdominal pain, diarrhea, nausea and vomiting.  ? ? ?Physical Exam ?Triage Vital Signs ?ED Triage Vitals  ?Enc Vitals Group  ?   BP   ?   Pulse   ?   Resp   ?   Temp   ?   Temp src   ?   SpO2   ?   Weight   ?   Height   ?   Head Circumference   ?   Peak Flow   ?   Pain  Score   ?   Pain Loc   ?   Pain Edu?   ?   Excl. in GC?   ? ?No data found. ? ?Updated Vital Signs ?BP 124/77 (BP Location: Left Arm)   Pulse 92   Temp 99 ?F (37.2 ?C) (Oral)   Resp 16   SpO2 100%  ?   ? ?Physical Exam ?Vitals and nursing note reviewed.  ?Constitutional:   ?   General: She is not in acute distress. ?   Appearance: Normal appearance. She is not ill-appearing.  ?HENT:  ?   Head: Normocephalic and atraumatic.  ?Eyes:  ?   Conjunctiva/sclera: Conjunctivae normal.  ?Cardiovascular:  ?   Rate and Rhythm: Normal rate.  ?Pulmonary:  ?   Effort: Pulmonary effort is normal.  ?Abdominal:  ?   General: Abdomen is flat. Bowel sounds are normal. There is no distension.  ?   Tenderness: There is abdominal tenderness (patient tender to lower abdomen diffusely).  ?Neurological:  ?   Mental Status: She is alert.  ?Psychiatric:     ?   Mood and Affect: Mood normal.     ?   Behavior: Behavior normal.     ?   Thought Content: Thought content normal.  ? ? ? ?UC Treatments / Results  ?Labs ?(all labs ordered are listed, but only abnormal results are displayed) ?Labs Reviewed - No data to display ? ?EKG ? ? ?Radiology ?No  results found. ? ?Procedures ?Procedures (including critical care time) ? ?Medications Ordered in UC ?Medications - No data to display ? ?Initial Impression / Assessment and Plan / UC Course  ?I have reviewed the triage vital signs and the nursing notes. ? ?Pertinent labs & imaging results that were available during my care of the patient were reviewed by me and considered in my medical decision making (see chart for details). ? ?  ?Given tenderness on exam with reported worsening symptoms recommended further evaluation in the ED for stat labs and imaging. Patient is agreeable with plan.  ? ?Final Clinical Impressions(s) / UC Diagnoses  ? ?Final diagnoses:  ?Lower abdominal pain  ? ? ? ?Discharge Instructions   ? ?  ?Please report to  ? ?[facility] ? ?[id number] ?[place], [place] [address] ? ? ? ? ? ? ? ?ED Prescriptions   ?None ?  ? ?PDMP not reviewed this encounter. ?  ?Tomi Bamberger, PA-C ?02/12/22 1306 ? ?

## 2022-02-12 NOTE — ED Triage Notes (Signed)
Patient reports to the ER for lower abdominal pain, emesis and nausea. Patient also endorses diarrhea. Patient states she feels like her insides are twisting. Endorses PCOS hx.  ?

## 2022-02-12 NOTE — ED Notes (Signed)
Pt has tolerated PO challenge 

## 2022-03-23 ENCOUNTER — Other Ambulatory Visit: Payer: Self-pay | Admitting: Obstetrics and Gynecology

## 2022-03-23 DIAGNOSIS — Z319 Encounter for procreative management, unspecified: Secondary | ICD-10-CM

## 2022-03-23 IMAGING — US US PELVIS COMPLETE TRANSABD/TRANSVAG W DUPLEX
1 series · 13 of 25 positions shown · non-contrast
Comparison: None.

CLINICAL DATA: Heavy vaginal bleeding for several years. Pelvic
pain for several months.

EXAM:
TRANSABDOMINAL AND TRANSVAGINAL ULTRASOUND OF PELVIS
DOPPLER ULTRASOUND OF OVARIES
TECHNIQUE: Both transabdominal and transvaginal ultrasound examinations of the
pelvis were performed. Transabdominal technique was performed for
global imaging of the pelvis including uterus, ovaries, adnexal
regions, and pelvic cul-de-sac.
It was necessary to proceed with endovaginal exam following the
transabdominal exam to visualize the ovaries. Color and duplex
Doppler ultrasound was utilized to evaluate blood flow to the
ovaries.

[Series 1: us pelvis (transabdominal only) · 13 of 54 slices shown]
[im 1/54]
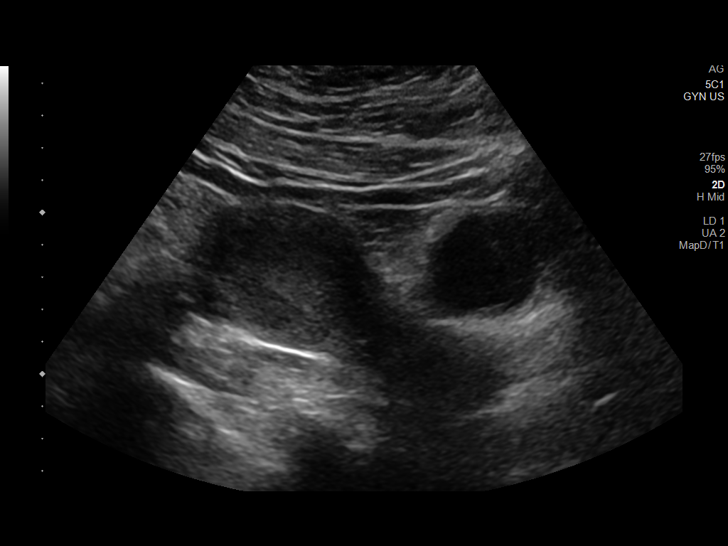
[im 5/54]
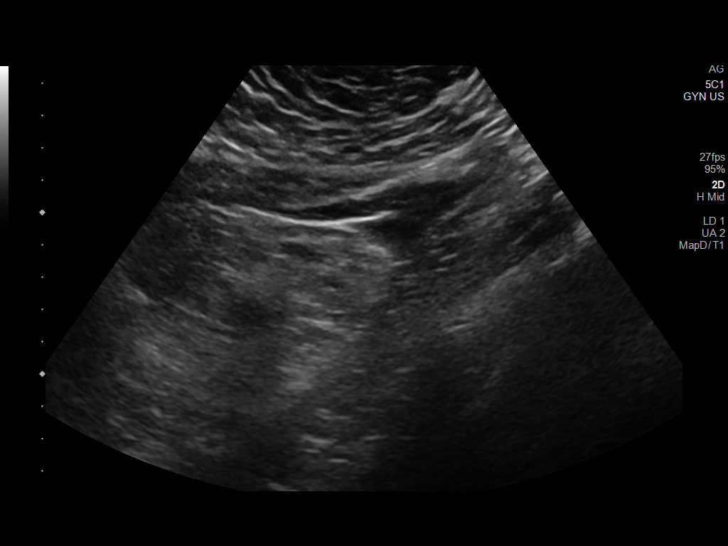
[im 9/54]
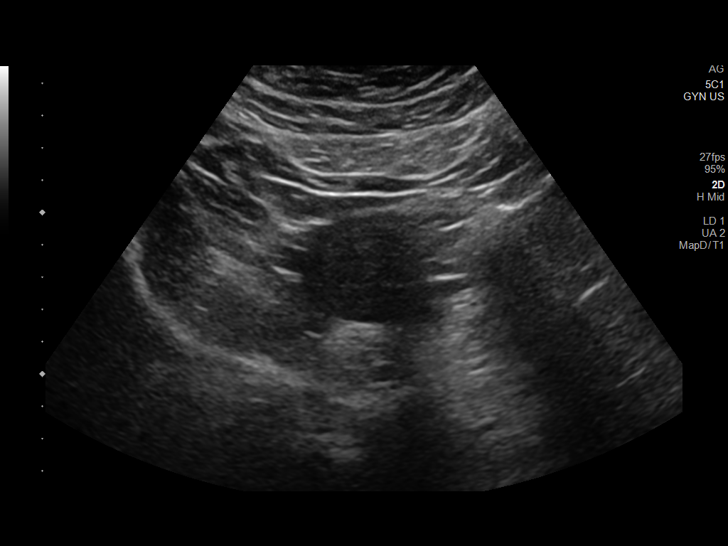
[im 14/54]
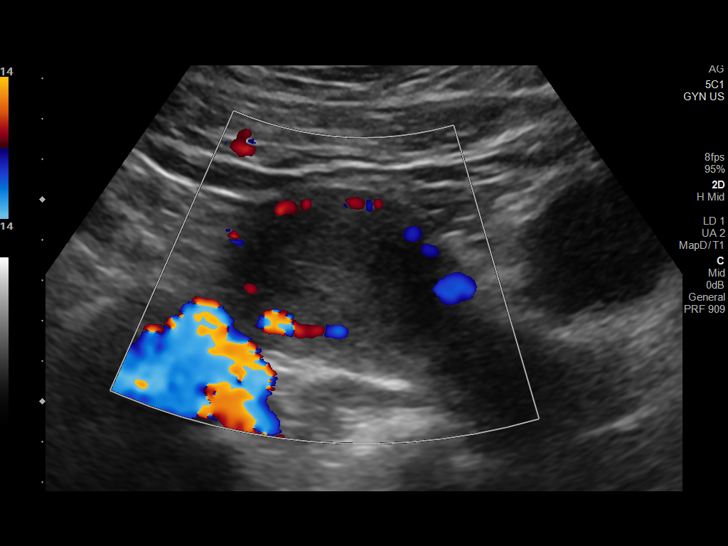
[im 18/54]
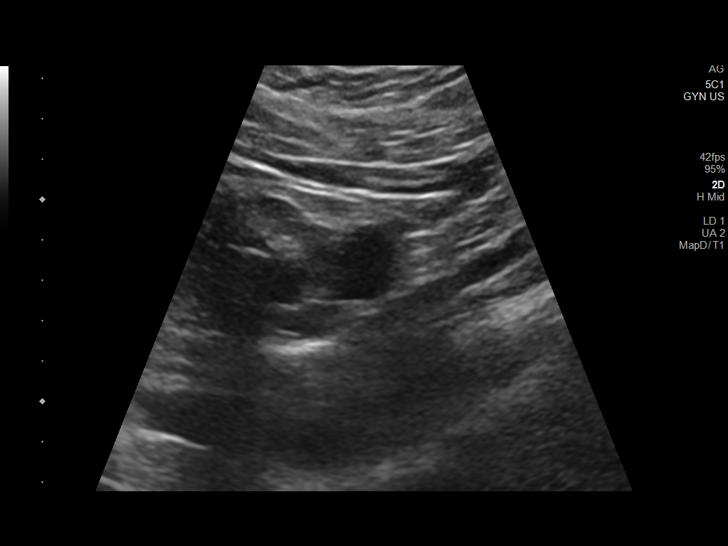
[im 23/54]
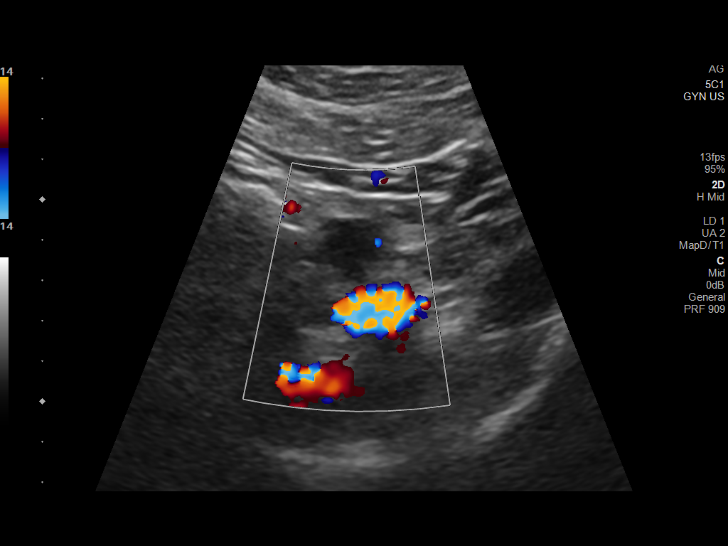
[im 27/54]
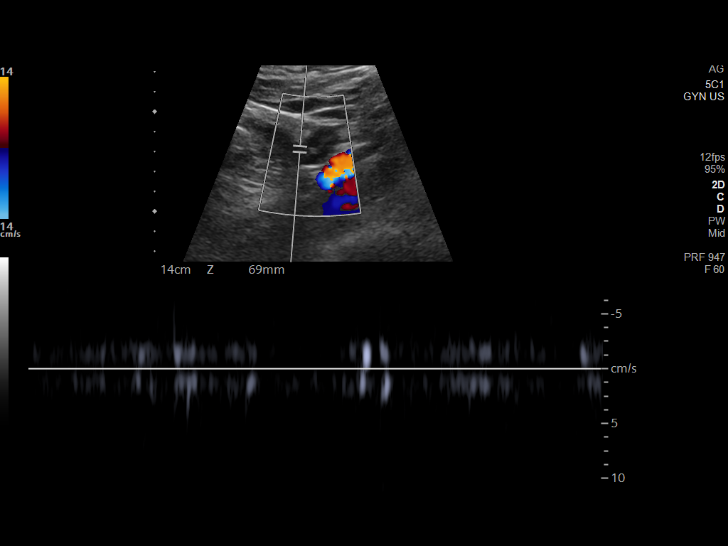
[im 31/54]
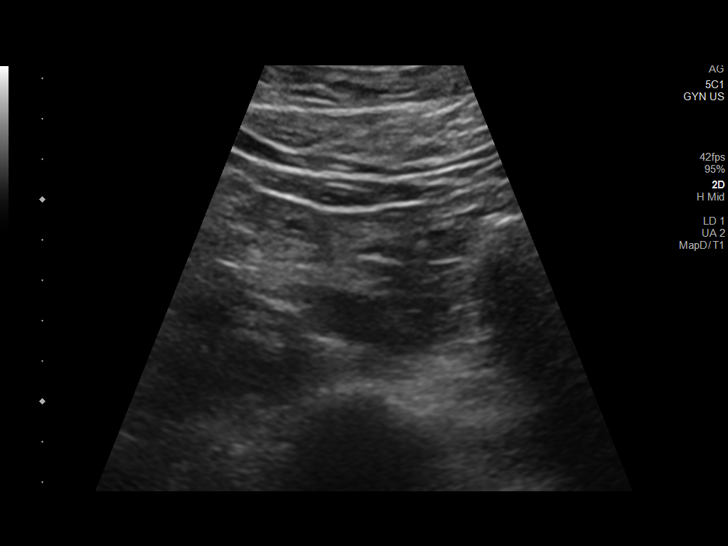
[im 36/54]
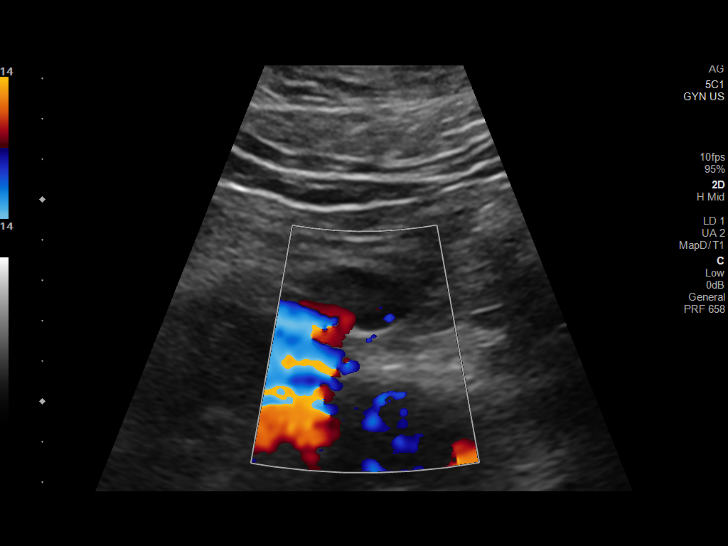
[im 40/54]
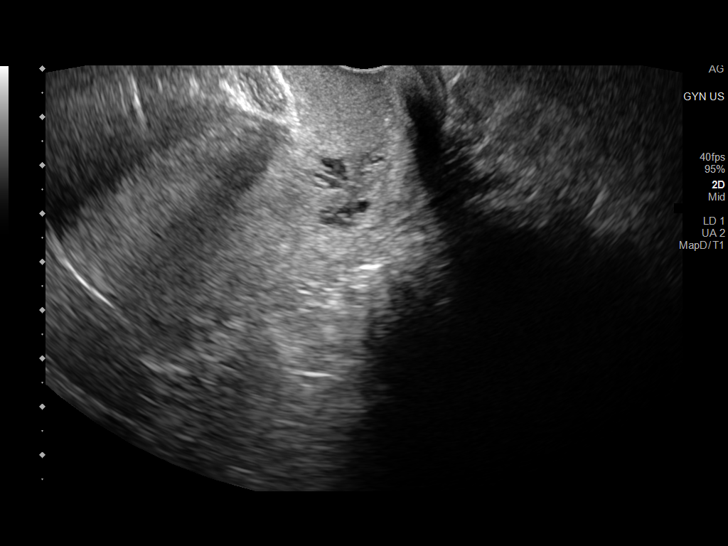
[im 45/54]
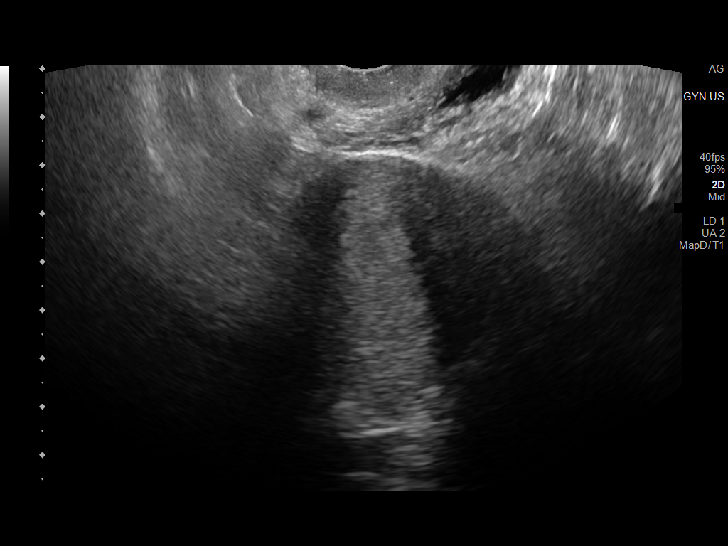
[im 49/54]
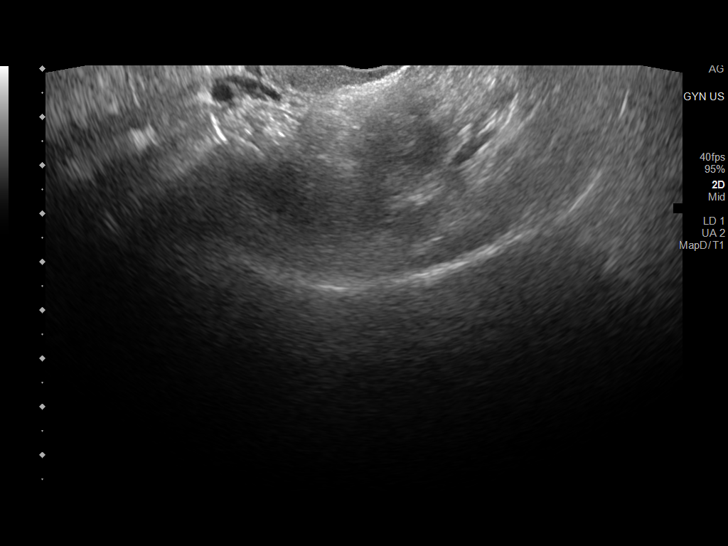
[im 54/54]
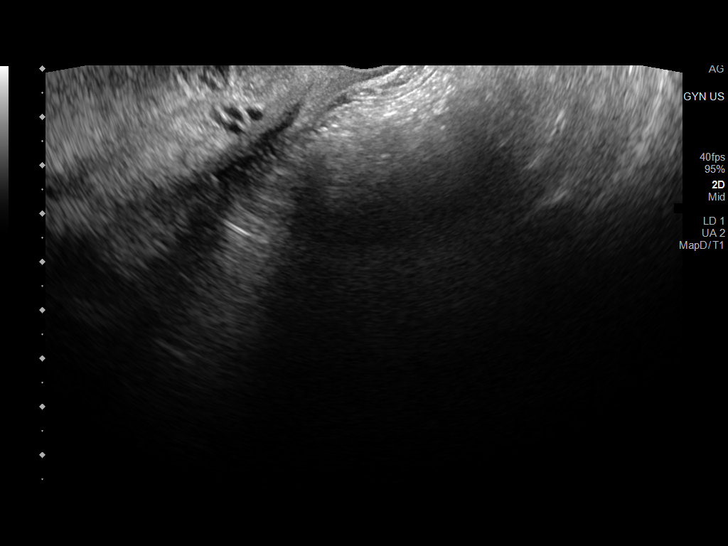

[13 of 25 positions shown; findings below may reference images not displayed]

FINDINGS: Uterus

Measurements: 10 x 4.5 x 4.7 cm (volume = 110 cm^3.) No fibroids
or other mass visualized.

Endometrium

Thickness: 1.6 cm.  No focal abnormality visualized.

Right ovary

Measurements: 2.6 x 1.5 x 2.3 cm = volume: 4.7 mL. Normal
appearance/no adnexal mass.

Left ovary

Measurements: 1.9 x 1.4 x 1.8 cm = volume: 2.5 mL. Normal
appearance/no adnexal mass.

Pulsed Doppler evaluation of both ovaries demonstrates normal
low-resistance arterial and venous waveforms.

Other findings

No abnormal free fluid.
IMPRESSION: 1. The endometrium measures 1.6 cm in maximum thickness. This is
upper limits of normal. If bleeding remains unresponsive to hormonal
or medical therapy, focal lesion work-up with sonohysterogram should
be considered. Endometrial biopsy should also be considered in
pre-menopausal patients at high risk for endometrial carcinoma.
(Ref: Radiological Reasoning: Algorithmic Workup of Abnormal Vaginal
Bleeding with Endovaginal Sonography and Sonohysterography. AJR
[id number]; 191:S68-73)

## 2022-03-24 ENCOUNTER — Other Ambulatory Visit: Payer: Self-pay | Admitting: Obstetrics and Gynecology

## 2022-03-24 DIAGNOSIS — Z319 Encounter for procreative management, unspecified: Secondary | ICD-10-CM

## 2022-04-02 ENCOUNTER — Encounter (HOSPITAL_BASED_OUTPATIENT_CLINIC_OR_DEPARTMENT_OTHER): Payer: Self-pay

## 2022-04-02 ENCOUNTER — Other Ambulatory Visit: Payer: Self-pay

## 2022-04-02 ENCOUNTER — Emergency Department (HOSPITAL_BASED_OUTPATIENT_CLINIC_OR_DEPARTMENT_OTHER): Payer: Self-pay

## 2022-04-02 ENCOUNTER — Emergency Department (HOSPITAL_BASED_OUTPATIENT_CLINIC_OR_DEPARTMENT_OTHER)
Admission: EM | Admit: 2022-04-02 | Discharge: 2022-04-02 | Discharge status: Against Medical Advice | Payer: Self-pay | Attending: Emergency Medicine | Admitting: Emergency Medicine

## 2022-04-02 DIAGNOSIS — R202 Paresthesia of skin: Secondary | ICD-10-CM | POA: Insufficient documentation

## 2022-04-02 DIAGNOSIS — G4489 Other headache syndrome: Secondary | ICD-10-CM | POA: Insufficient documentation

## 2022-04-02 LAB — CBC
HCT: 33 % — ABNORMAL LOW (ref 36.0–46.0)
Hemoglobin: 9.9 g/dL — ABNORMAL LOW (ref 12.0–15.0)
MCH: 21.4 pg — ABNORMAL LOW (ref 26.0–34.0)
MCHC: 30 g/dL (ref 30.0–36.0)
MCV: 71.4 fL — ABNORMAL LOW (ref 80.0–100.0)
Platelets: 245 10*3/uL (ref 150–400)
RBC: 4.62 MIL/uL (ref 3.87–5.11)
RDW: 19.7 % — ABNORMAL HIGH (ref 11.5–15.5)
WBC: 8 10*3/uL (ref 4.0–10.5)
nRBC: 0 % (ref 0.0–0.2)

## 2022-04-02 LAB — COMPREHENSIVE METABOLIC PANEL
ALT: 23 U/L (ref 0–44)
AST: 17 U/L (ref 15–41)
Albumin: 4.2 g/dL (ref 3.5–5.0)
Alkaline Phosphatase: 100 U/L (ref 38–126)
Anion gap: 7 (ref 5–15)
BUN: 15 mg/dL (ref 6–20)
CO2: 25 mmol/L (ref 22–32)
Calcium: 8.7 mg/dL — ABNORMAL LOW (ref 8.9–10.3)
Chloride: 105 mmol/L (ref 98–111)
Creatinine, Ser: 0.73 mg/dL (ref 0.44–1.00)
GFR, Estimated: >60 mL/min (ref >60)
Glucose, Bld: 120 mg/dL — ABNORMAL HIGH (ref 70–99)
Potassium: 4 mmol/L (ref 3.5–5.1)
Sodium: 137 mmol/L (ref 135–145)
Total Bilirubin: 0.3 mg/dL (ref 0.3–1.2)
Total Protein: 7.3 g/dL (ref 6.5–8.1)

## 2022-04-02 LAB — DIFFERENTIAL
Abs Immature Granulocytes: 0.02 10*3/uL (ref 0.00–0.07)
Basophils Absolute: 0 10*3/uL (ref 0.0–0.1)
Basophils Relative: 1 %
Eosinophils Absolute: 0.1 10*3/uL (ref 0.0–0.5)
Eosinophils Relative: 1 %
Immature Granulocytes: 0 %
Lymphocytes Relative: 42 %
Lymphs Abs: 3.4 10*3/uL (ref 0.7–4.0)
Monocytes Absolute: 0.6 10*3/uL (ref 0.1–1.0)
Monocytes Relative: 7 %
Neutro Abs: 3.9 10*3/uL (ref 1.7–7.7)
Neutrophils Relative %: 49 %

## 2022-04-02 LAB — ETHANOL: Alcohol, Ethyl (B): <10 mg/dL (ref <10)

## 2022-04-02 MED ORDER — KETOROLAC TROMETHAMINE 15 MG/ML IJ SOLN
15.0000 mg | Freq: Once | INTRAMUSCULAR | Status: AC
  Administered 2022-04-02: 15 mg via INTRAVENOUS
  Filled 2022-04-02: qty 1

## 2022-04-02 MED ORDER — DIPHENHYDRAMINE HCL 50 MG/ML IJ SOLN
25.0000 mg | Freq: Once | INTRAMUSCULAR | Status: AC
  Administered 2022-04-02: 25 mg via INTRAVENOUS
  Filled 2022-04-02: qty 1

## 2022-04-02 MED ORDER — PROCHLORPERAZINE EDISYLATE 10 MG/2ML IJ SOLN
10.0000 mg | Freq: Once | INTRAMUSCULAR | Status: AC
  Administered 2022-04-02: 10 mg via INTRAVENOUS
  Filled 2022-04-02: qty 2

## 2022-04-02 NOTE — ED Notes (Signed)
Tele-Neuro notification complete.  ?

## 2022-04-02 NOTE — ED Provider Notes (Signed)
?[facility]PT ?Provider Note ? ? ?CSN: [id number] ?Arrival date & time: 04/02/22  0159 ? ?  ? ?History ? ?Chief Complaint  ?Patient presents with  ? Hand Numbness  ? ? ?Lori Tran is a 31 y.o. female. ? ?The history is provided by the patient.  ?Neurologic Problem ?This is a new problem. The current episode started 1 to 2 hours ago. The problem occurs constantly. The problem has been gradually improving. Associated symptoms include headaches. Pertinent negatives include no chest pain and no shortness of breath. Nothing aggravates the symptoms.  ?Patient presents with headache, blurred vision and right hand numbness ? ?Patient reports she was at work approximately 2 hours ago.  She does packaging at work, and she was notified by her coworkers that she was not doing it correctly.  She then felt a headache and felt that she had double vision and blurred vision.  She then began having numbness in her right fingers.  No focal weakness.  No slurred speech.  She has had migraine type headaches previously but never with these additional symptoms.  No previous history of stroke.  She does not take any oral contraceptives.  She is a non-smoker. ?  ? ?Home Medications ?Prior to Admission medications   ?Medication Sig Start Date End Date Taking? Authorizing Provider  ?ferrous sulfate 325 (65 FE) MG tablet Take 1 tab every other day with OJ. 11/14/20   Jerene BearsMiller, Mary S, MD  ?ondansetron (ZOFRAN-ODT) 4 MG disintegrating tablet Take 1 tablet (4 mg total) by mouth every 8 (eight) hours as needed for nausea or vomiting. 02/12/22   Haskel SchroederBadalamente, Peter R, PA-C  ?pantoprazole (PROTONIX) 20 MG tablet Take 1 tablet (20 mg total) by mouth daily. 02/12/22 03/14/22  Haskel SchroederBadalamente, Peter R, PA-C  ?Prenatal Vit-Fe Fumarate-FA (MULTIVITAMIN-PRENATAL) 27-0.8 MG TABS tablet Take 1 tablet by mouth daily at 12 noon.    [provider]  ?progesterone (PROMETRIUM) 200 MG capsule Take a tablet first ten days of  each month 03/26/21   Hermina StaggersErvin, Michael L, MD  ?   ? ?Allergies    ?Patient has no known allergies.   ? ?Review of Systems   ?Review of Systems  ?Constitutional:  Negative for fever.  ?Eyes:  Positive for photophobia and visual disturbance.  ?Respiratory:  Negative for shortness of breath.   ?Cardiovascular:  Negative for chest pain.  ?Gastrointestinal:  Negative for vomiting.  ?Neurological:  Positive for numbness and headaches. Negative for facial asymmetry and speech difficulty.  ? ?Physical Exam ?Updated Vital Signs ?BP 126/77   Pulse (!) 54   Temp 98.2 ?F (36.8 ?C) (Oral)   Resp 16   Ht 1.549 m (5\' 1" )   Wt 81.6 kg   LMP 03/29/2022   SpO2 100%   BMI 34.01 kg/m?  ?Physical Exam ?CONSTITUTIONAL: Well developed/well nourished, tearful ?HEAD: Normocephalic/atraumatic ?EYES: EOMI/PERRL, no nystagmus, no visual ?field deficit  no ptosis ?ENMT: Mucous membranes moist ?NECK: supple no meningeal signs, no bruits ?CV: S1/S2 noted, no murmurs/rubs/gallops noted ?LUNGS: Lungs are clear to auscultation bilaterally, no apparent distress ?ABDOMEN: soft, nontender, no rebound or guarding ?GU:no cva tenderness ?NEURO:Awake/alert, face symmetric, no arm or leg drift is noted ?Equal 5/5 strength with shoulder abduction, elbow flex/extension, wrist flex/extension in upper extremities and equal hand grips bilaterally ?Equal 5/5 strength with hip flexion,knee flex/extension, foot dorsi/plantar flexion ?Cranial nerves 3/4/5/6/05/31/09/11/12 tested and intact ?No past pointing ?Normal heel-to-shin ?Sensation to light touch intact in all extremities ?NIHSS=0 ?EXTREMITIES: pulses normal, full ROM ?  SKIN: warm, color normal ?PSYCH: Anxious and tearful ?ED Results / Procedures / Treatments   ?Labs ?(all labs ordered are listed, but only abnormal results are displayed) ?Labs Reviewed  ?CBC - Abnormal; Notable for the following components:  ?    Result Value  ? Hemoglobin 9.9 (*)   ? HCT 33.0 (*)   ? MCV 71.4 (*)   ? MCH 21.4 (*)   ? RDW  19.7 (*)   ? All other components within normal limits  ?COMPREHENSIVE METABOLIC PANEL - Abnormal; Notable for the following components:  ? Glucose, Bld 120 (*)   ? Calcium 8.7 (*)   ? All other components within normal limits  ?ETHANOL  ?DIFFERENTIAL  ? ? ?EKG ?ED ECG REPORT ? ? Date: 04/02/2022 0208am ? Rate: 70 ? Rhythm: normal sinus rhythm ? QRS Axis: normal ? Intervals: normal ? ST/T Wave abnormalities: normal ? Conduction Disutrbances:none ? ? ?I have personally reviewed the EKG tracing and agree with the computerized printout as noted. ? ?Radiology ?CT HEAD WO CONTRAST ? ?Result Date: 04/02/2022 ?CLINICAL DATA:  Headache with blurred vision and right hand numbness. EXAM: CT HEAD WITHOUT CONTRAST TECHNIQUE: Contiguous axial images were obtained from the base of the skull through the vertex without intravenous contrast. RADIATION DOSE REDUCTION: This exam was performed according to the departmental dose-optimization program which includes automated exposure control, adjustment of the mA and/or kV according to patient size and/or use of iterative reconstruction technique. COMPARISON:  October 24, 2020 FINDINGS: Brain: No evidence of acute infarction, hemorrhage, hydrocephalus, extra-axial collection or mass lesion/mass effect. Vascular: No hyperdense vessel or unexpected calcification. Skull: Normal. Negative for fracture or focal lesion. Sinuses/Orbits: No acute finding. Other: None. IMPRESSION: No acute intracranial abnormality. Electronically Signed   By: Aram Candela M.D.   On: 04/02/2022 02:45   ? ?Procedures ?Procedures  ? ? ?Medications Ordered in ED ?Medications  ?prochlorperazine (COMPAZINE) injection 10 mg (10 mg Intravenous Given 04/02/22 0242)  ?ketorolac (TORADOL) 15 MG/ML injection 15 mg (15 mg Intravenous Given 04/02/22 0257)  ?diphenhydrAMINE (BENADRYL) injection 25 mg (25 mg Intravenous Given 04/02/22 0257)  ? ? ?ED Course/ Medical Decision Making/ A&P ?Clinical Course as of 04/02/22 0321  ?Thu  Apr 02, 2022  ?0236 Patient presents with headache, blurred vision and right hand numbness.  At this time there is no focal neurodeficits.  Neurologic work-up has been initiated [DW]  ?0306 Hemoglobin(!): 9.9 ?Mild anemia [DW]  ?5726 Patient now demanding discharge immediately.  Reports he wants to go home.  She thinks she had a reaction to the Compazine.  She was given Benadryl and Toradol but she continues to have symptoms.  She continues to demand discharge [DW]  ?2035 Advised that I am unable to completely rule out a emergent neurologic cause of her symptoms.  Patient agrees to sign out against advice. [DW]  ?5974 I discussed risk of death/disability of leaving against medical advice and the patient accepts these risks.  The patient is awake/alert able to make decisions, and does not appear intoxicated ?Patient discharged against medical advice. [DW]  ?  ?Clinical Course User Index ?[DW] Zadie Rhine, MD  ? ?                        ?Medical Decision Making ?Amount and/or Complexity of Data Reviewed ?Labs: ordered. Decision-making details documented in ED Course. ?Radiology: ordered. ? ?Risk ?Prescription drug management. ? ? ?This patient presents to the ED for concern of headache,  blurred vision, numbness, this involves an extensive number of treatment options, and is a complaint that carries with it a high risk of complications and morbidity.  The differential diagnosis includes but is not limited to CVA, migraine, brain tumor, intracranial hemorrhage, idiopathic intracranial hypertension, CVST ? ?Comorbidities that complicate the patient evaluation: ?Patient?s presentation is complicated by their history of migraines ? ?Social Determinants of Health: ?Patient?s impaired access to primary care  increases the complexity of managing their presentation ? ?Additional history obtained: ?Additional history obtained from significant other ? ? ?Lab Tests: ?I Ordered, and personally interpreted labs.  The pertinent  results include: Mild anemia ? ?Imaging Studies ordered: ?I ordered imaging studies including CT scan head   ?I independently visualized and interpreted imaging which showed no acute finding ?I agree with the

## 2022-04-02 NOTE — ED Triage Notes (Signed)
Arrives POV from work with c/o headache and blurry vision that has resolved, and right hand (thumb) numbness. NIH-1.  ? ?Sx began at 0100.  ?

## 2022-04-02 NOTE — ED Notes (Signed)
Pt requesting to leave facility AMA. Removed patient's IV. Unable to get signature for AMA form due to the topaz signature pad not working. Pt ambulatory from department.  ?

## 2022-04-02 NOTE — Discharge Instructions (Signed)

## 2022-10-19 ENCOUNTER — Other Ambulatory Visit (HOSPITAL_COMMUNITY)
Admission: RE | Admit: 2022-10-19 | Discharge: 2022-10-19 | Disposition: A | Discharge status: Home / Self Care | Payer: Self-pay | Source: Ambulatory Visit | Attending: Obstetrics and Gynecology | Admitting: Obstetrics and Gynecology

## 2022-10-19 ENCOUNTER — Ambulatory Visit (INDEPENDENT_AMBULATORY_CARE_PROVIDER_SITE_OTHER): Payer: Self-pay | Admitting: Obstetrics and Gynecology

## 2022-10-19 ENCOUNTER — Encounter: Payer: Self-pay | Admitting: Obstetrics and Gynecology

## 2022-10-19 VITALS — BP 132/89 | HR 78 | Wt 200.0 lb

## 2022-10-19 DIAGNOSIS — E282 Polycystic ovarian syndrome: Secondary | ICD-10-CM

## 2022-10-19 DIAGNOSIS — Z113 Encounter for screening for infections with a predominantly sexual mode of transmission: Secondary | ICD-10-CM | POA: Insufficient documentation

## 2022-10-19 DIAGNOSIS — Z789 Other specified health status: Secondary | ICD-10-CM

## 2022-10-19 DIAGNOSIS — Z3202 Encounter for pregnancy test, result negative: Secondary | ICD-10-CM

## 2022-10-19 DIAGNOSIS — N926 Irregular menstruation, unspecified: Secondary | ICD-10-CM

## 2022-10-19 DIAGNOSIS — Z1330 Encounter for screening examination for mental health and behavioral disorders, unspecified: Secondary | ICD-10-CM

## 2022-10-19 NOTE — Progress Notes (Signed)
GYNECOLOGY VISIT  Patient name: Lori Tran MRN [id number]  Date of birth: Apr 05, 1991 Chief Complaint:   Vaginal Bleeding  History:  Lori Tran is a 31 y.o. G0P0000 being seen today for spotting since September. In 2021 seen for heavy menstrual bleeding, had a polyp and HSC with polyp removal. Afterwards was on progesterone to help with cycles but did not ovulate when checking OPK. Had spotting like this before the polyp - previously had spotting intermixed with heavy bleeding. Also reports having had some vaginal discharge and itching . Currently sexually active, noticed vaginal dryness . Wants to get pregnant ASAP, not getting any positives on OPKs. Last pregnancy test 3 months ago.   Requesting STI testing due to intercourse with other partner during break   Past Medical History:  Diagnosis Date   Anemia    COVID 11/2018   sob, diarrhea fever loss of taste and smell weakness x 1 month all symptoms resolved   Headache    PCOS (polycystic ovarian syndrome)    Thickened endometrium    Wears glasses     Past Surgical History:  Procedure Laterality Date   HYSTEROSCOPY WITH D & C N/A 12/24/2020   Procedure: DILATATION AND CURETTAGE /HYSTEROSCOPY;  Surgeon: Jerene Bears, MD;  Location: [facility];  Service: Gynecology;  Laterality: N/A;    The following portions of the patient's history were reviewed and updated as appropriate: allergies, current medications, past family history, past medical history, past social history, past surgical history and problem list.   Health Maintenance:   Last pap 12/2020. Results were: NILM w/ HRHPV not done. H/O abnormal pap: no Last mammogram: n/a   Review of Systems:  Pertinent items are noted in HPI. Comprehensive review of systems was otherwise negative.   Objective:  Physical Exam BP 132/89   Pulse 78   Wt 200 lb (90.7 kg)   BMI 37.79 kg/m    Physical Exam Vitals and nursing note reviewed. Exam  conducted with a chaperone present.  Constitutional:      Appearance: Normal appearance.  HENT:     Head: Normocephalic and atraumatic.  Cardiovascular:     Rate and Rhythm: Normal rate and regular rhythm.  Pulmonary:     Effort: Pulmonary effort is normal.     Breath sounds: Normal breath sounds.  Genitourinary:    General: Normal vulva.     Vagina: Normal.     Cervix: Normal.     Uterus: Normal.   Skin:    General: Skin is warm and dry.  Neurological:     General: No focal deficit present.     Mental Status: She is alert.  Psychiatric:        Mood and Affect: Mood normal.        Behavior: Behavior normal.        Thought Content: Thought content normal.        Judgment: Judgment normal.     Labs and Imaging No results found.     Assessment & Plan:  1. Irregular menses Hx of PCOS and polyp, ordered for sonohyst to evaluate endometrial cavity for polyp as well as part of infertility workup.  - HgB A1c - TSH Rfx on Abnormal to Free T4 - [place] Sonohysterogram; Future  2. PCOS (polycystic ovarian syndrome) Pelvic imaging to evaluate endometrial lining and cavity.  Interested in getting pregnant, briefly discussed use of ovulation induction medications given history of PCOS & reported negative ovulation predictor kits  at home.  Reviewed that if unable to conceive with ovulation induction medications, would refer to REI.  Pregnancy testing today to rule out pregnancy.  - Ambulatory Referral to Primary Care - [place] Sonohysterogram; Future  3. Screening for STD (sexually transmitted disease) Requested screening completed  - Cervicovaginal ancillary only( [place]) - RPR - HIV Antibody (routine testing w rflx) - Hepatitis B Surface AntiGEN - Hepatitis C Antibody  4. Advised about management of weight Discussed PCOS is metabolic disorder, can be accompanied by difficulties with managing weight.  Requesting referral to primary care to help with weight management.   Routine  preventative health maintenance measures emphasized.  Lori Shire, MD [facility], [facility]

## 2022-10-19 NOTE — Patient Instructions (Signed)
Merchandiser, retail for IUI and IVF

## 2022-10-20 LAB — CERVICOVAGINAL ANCILLARY ONLY
Bacterial Vaginitis (gardnerella): POSITIVE — AB
Candida Glabrata: NEGATIVE
Candida Vaginitis: NEGATIVE
Chlamydia: NEGATIVE
Comment: NEGATIVE
Comment: NEGATIVE
Comment: NEGATIVE
Comment: NEGATIVE
Comment: NEGATIVE
Comment: NORMAL
Neisseria Gonorrhea: NEGATIVE
Trichomonas: NEGATIVE

## 2022-10-20 LAB — TSH RFX ON ABNORMAL TO FREE T4: TSH: 1.86 u[IU]/mL (ref 0.450–4.500)

## 2022-10-20 LAB — HEPATITIS C ANTIBODY: Hep C Virus Ab: NONREACTIVE

## 2022-10-20 LAB — HEMOGLOBIN A1C
Est. average glucose Bld gHb Est-mCnc: 148 mg/dL
Hgb A1c MFr Bld: 6.8 % — ABNORMAL HIGH (ref 4.8–5.6)

## 2022-10-20 LAB — HEPATITIS B SURFACE ANTIGEN: Hepatitis B Surface Ag: NEGATIVE

## 2022-10-20 LAB — POCT PREGNANCY, URINE: Preg Test, Ur: NEGATIVE

## 2022-10-20 LAB — HIV ANTIBODY (ROUTINE TESTING W REFLEX): HIV Screen 4th Generation wRfx: NONREACTIVE

## 2022-10-20 LAB — RPR: RPR Ser Ql: NONREACTIVE

## 2023-08-30 IMAGING — CT CT HEAD W/O CM
4 series · 16 of 47 positions shown, 18 images · non-contrast
Comparison: October 24, 2020

CLINICAL DATA: Headache with blurred vision and right hand
numbness.



[Series 2: head bone · axial · 0.39mm/px · z∈[-330,-300]mm · 3 of 76 slices shown]
[im 8/76  bone]
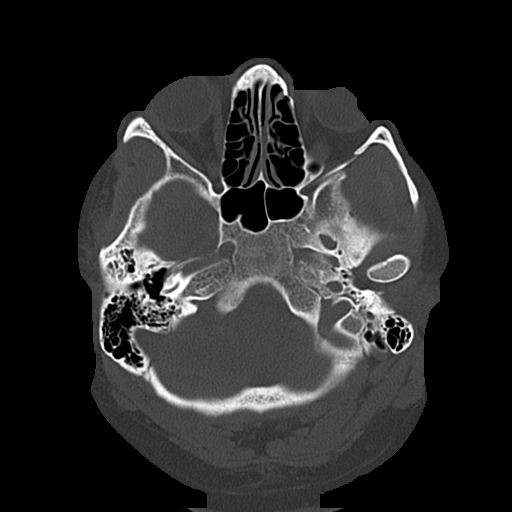
[im 16/76  bone]
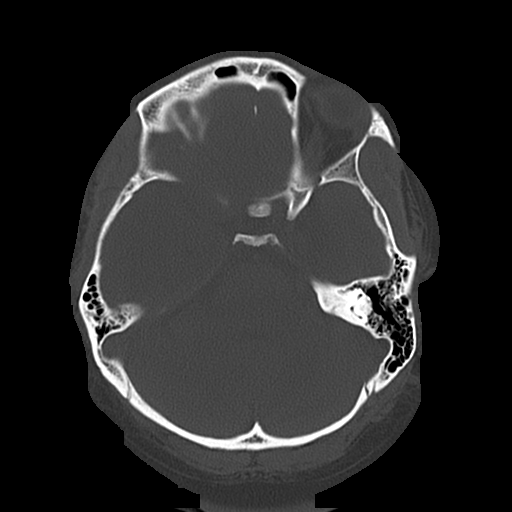
[im 23/76  bone]
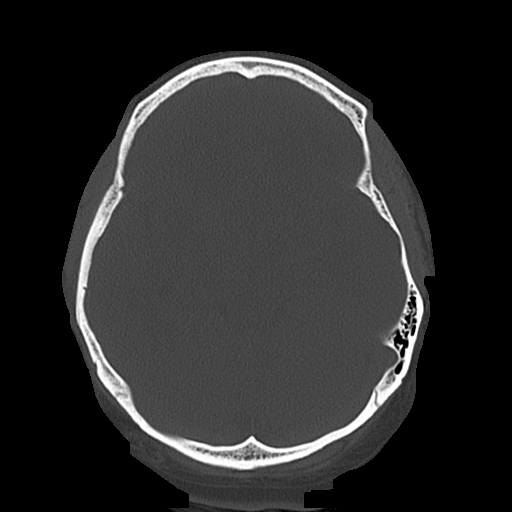

[Series 3: head wo · axial · 0.39mm/px · z∈[-329,-214]mm · 7 of 31 slices shown, 9 images]
[im 4/31  brain]
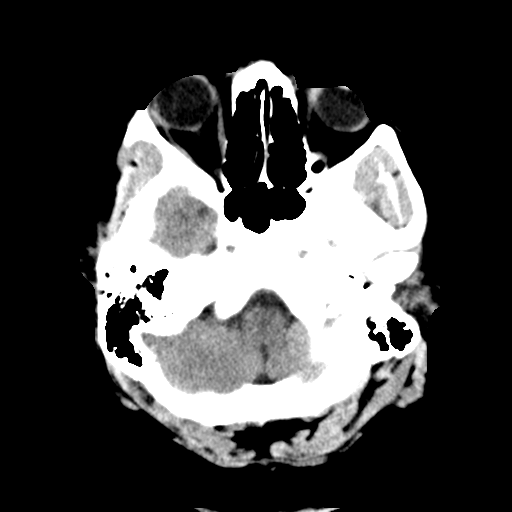
[im 4/31  bone]
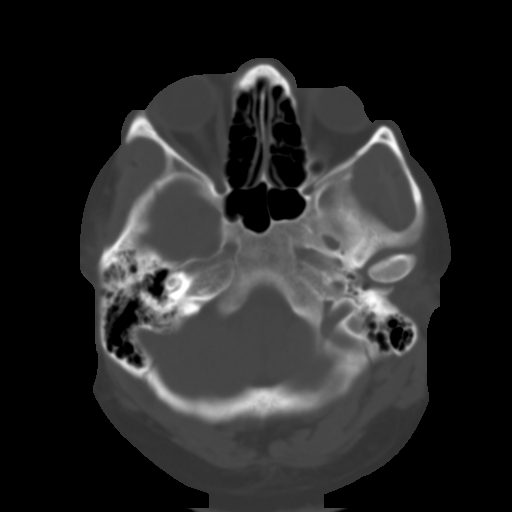
[im 8/31  brain]
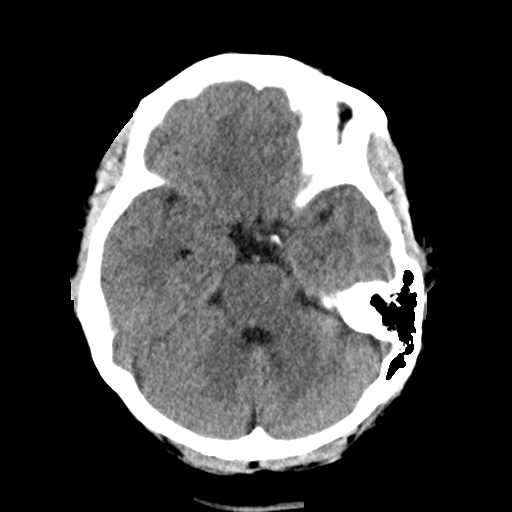
[im 12/31  brain]
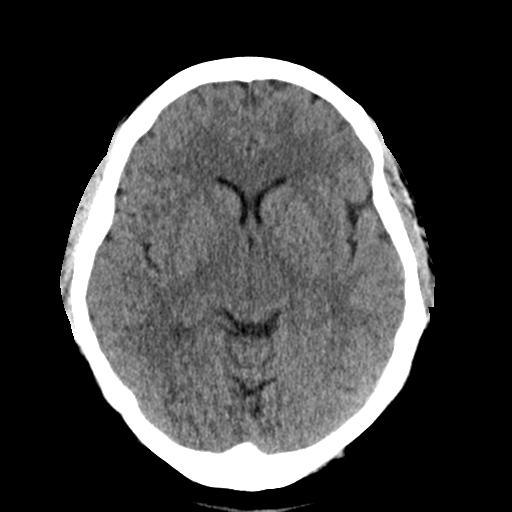
[im 16/31  brain]
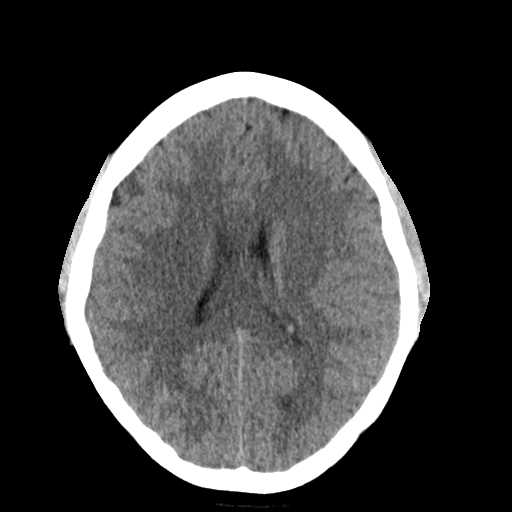
[im 19/31  brain]
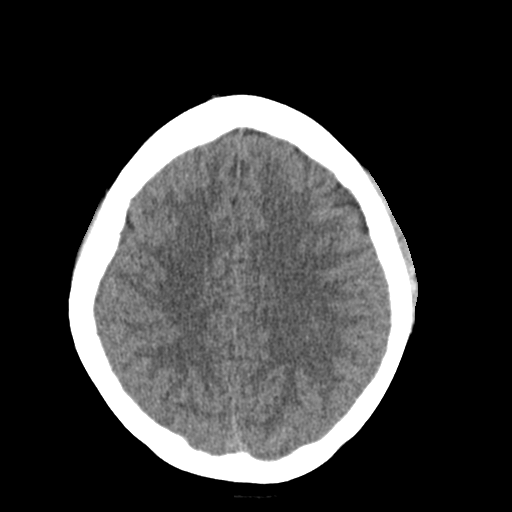
[im 19/31  bone]
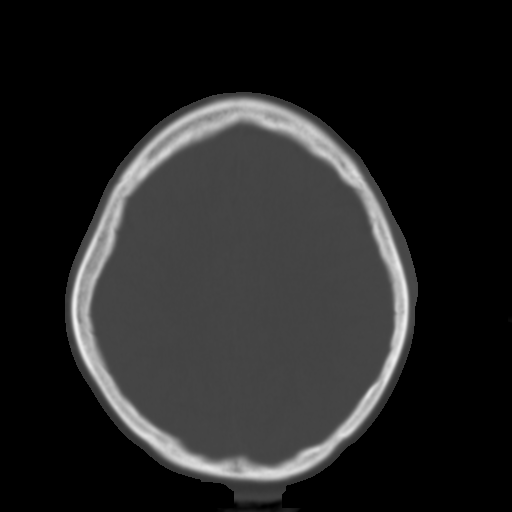
[im 23/31  brain]
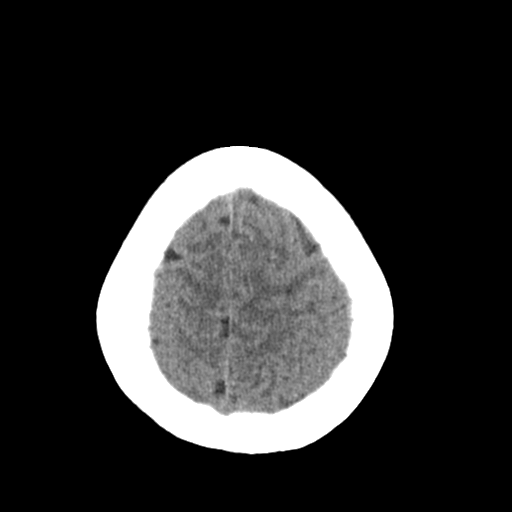
[im 27/31  brain]
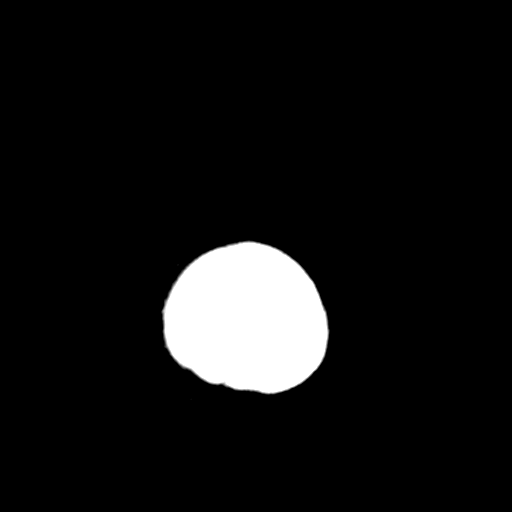

[Series 5: coronal soft · coronal · 0.29mm/px · 3 of 61 slices shown]
[im 21/61  brain]
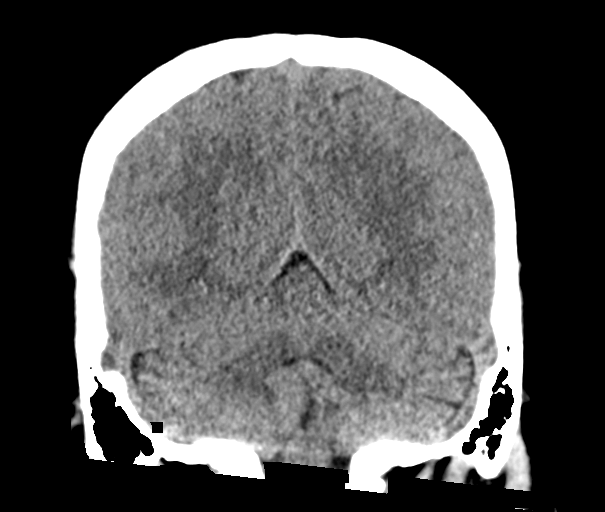
[im 27/61  brain]
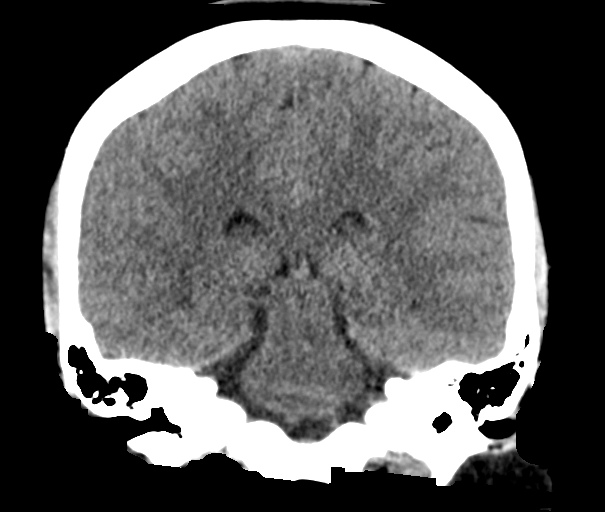
[im 34/61  brain]
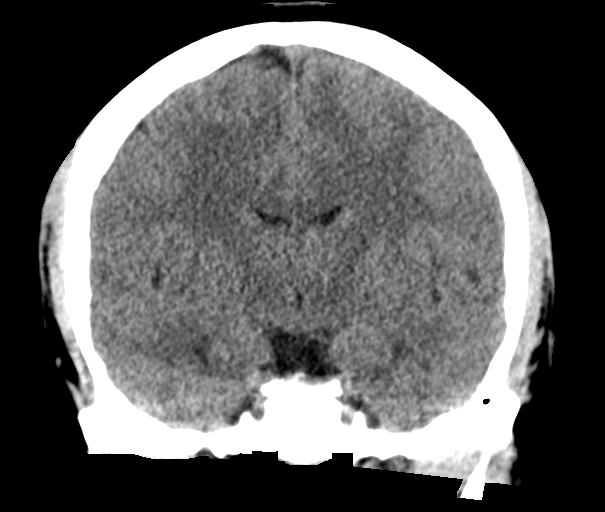

[Series 6: sagittal soft · sagittal · 0.29mm/px · 3 of 57 slices shown]
[im 19/57  brain]
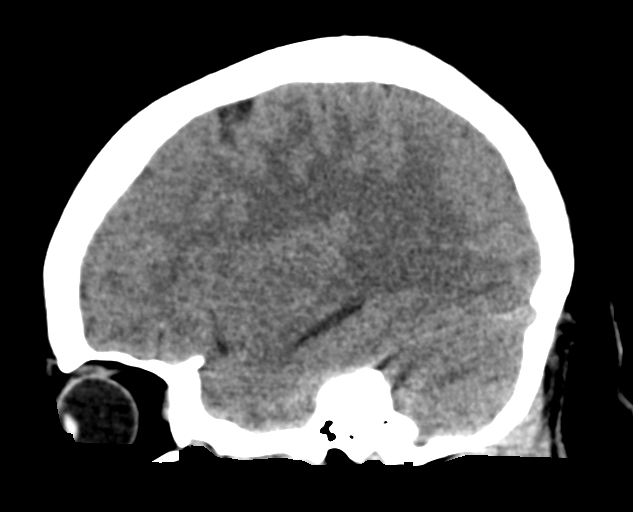
[im 29/57  brain]
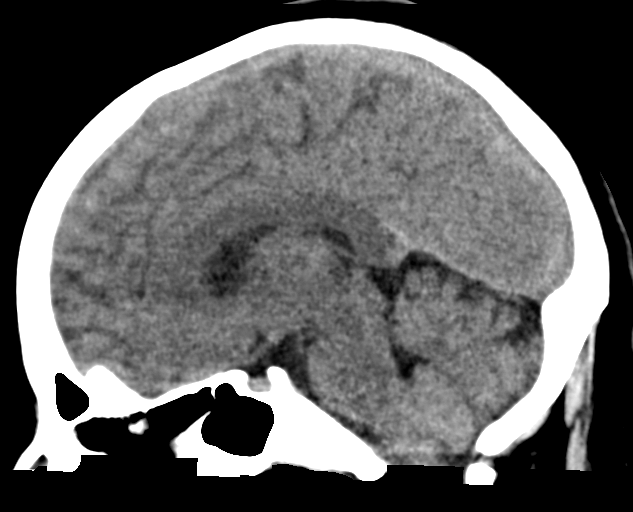
[im 38/57  brain]
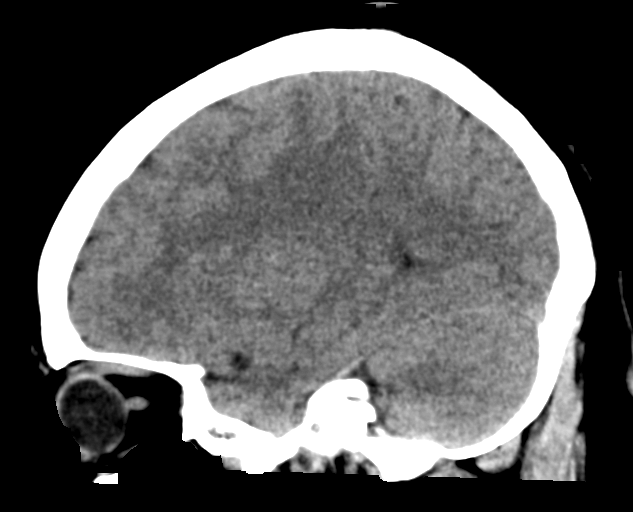

[16 of 47 positions shown; findings below may reference images not displayed]

FINDINGS: Brain: No evidence of acute infarction, hemorrhage, hydrocephalus,
extra-axial collection or mass lesion/mass effect.

Vascular: No hyperdense vessel or unexpected calcification.

Skull: Normal. Negative for fracture or focal lesion.

Sinuses/Orbits: No acute finding.

Other: None.
IMPRESSION: No acute intracranial abnormality.
